# Patient Record
Sex: Female | Born: 1990 | Race: Black or African American | Hispanic: No | Marital: Single | Smoking: Current some day smoker
Health system: Southern US, Community
[De-identification: ages and names within clinical notes are randomized; demographics above are authoritative.]

## PROBLEM LIST (undated history)

## (undated) DIAGNOSIS — K219 Gastro-esophageal reflux disease without esophagitis: Secondary | ICD-10-CM

## (undated) DIAGNOSIS — M419 Scoliosis, unspecified: Secondary | ICD-10-CM

## (undated) HISTORY — PX: BACK SURGERY: SHX140

## (undated) HISTORY — DX: Gastro-esophageal reflux disease without esophagitis: K21.9

---

## 2010-08-15 ENCOUNTER — Ambulatory Visit (INDEPENDENT_AMBULATORY_CARE_PROVIDER_SITE_OTHER): Payer: BC Managed Care – PPO

## 2010-08-15 ENCOUNTER — Inpatient Hospital Stay (INDEPENDENT_AMBULATORY_CARE_PROVIDER_SITE_OTHER)
Admission: RE | Admit: 2010-08-15 | Discharge: 2010-08-15 | Disposition: A | Discharge status: Home / Self Care | Payer: BC Managed Care – PPO | Source: Ambulatory Visit | Attending: Emergency Medicine | Admitting: Emergency Medicine

## 2010-08-15 DIAGNOSIS — S8010XA Contusion of unspecified lower leg, initial encounter: Secondary | ICD-10-CM

## 2010-08-15 DIAGNOSIS — S139XXA Sprain of joints and ligaments of unspecified parts of neck, initial encounter: Secondary | ICD-10-CM

## 2013-03-16 ENCOUNTER — Emergency Department (HOSPITAL_COMMUNITY): Payer: Self-pay

## 2013-03-16 ENCOUNTER — Emergency Department (HOSPITAL_COMMUNITY)
Admission: EM | Admit: 2013-03-16 | Discharge: 2013-03-16 | Disposition: A | Discharge status: Home / Self Care | Payer: Self-pay | Attending: Emergency Medicine | Admitting: Emergency Medicine

## 2013-03-16 ENCOUNTER — Emergency Department (HOSPITAL_COMMUNITY): Payer: BC Managed Care – PPO

## 2013-03-16 ENCOUNTER — Encounter (HOSPITAL_COMMUNITY): Payer: Self-pay | Admitting: Emergency Medicine

## 2013-03-16 DIAGNOSIS — S8990XA Unspecified injury of unspecified lower leg, initial encounter: Secondary | ICD-10-CM | POA: Insufficient documentation

## 2013-03-16 DIAGNOSIS — M25511 Pain in right shoulder: Secondary | ICD-10-CM

## 2013-03-16 DIAGNOSIS — IMO0002 Reserved for concepts with insufficient information to code with codable children: Secondary | ICD-10-CM | POA: Insufficient documentation

## 2013-03-16 DIAGNOSIS — Z8739 Personal history of other diseases of the musculoskeletal system and connective tissue: Secondary | ICD-10-CM | POA: Insufficient documentation

## 2013-03-16 HISTORY — DX: Scoliosis, unspecified: M41.9

## 2013-03-16 MED ORDER — HYDROMORPHONE HCL PF 1 MG/ML IJ SOLN
1.0000 mg | Freq: Once | INTRAMUSCULAR | Status: AC
  Administered 2013-03-16: 1 mg via INTRAMUSCULAR
  Filled 2013-03-16: qty 1

## 2013-03-16 MED ORDER — OXYCODONE-ACETAMINOPHEN 5-325 MG PO TABS: 1.0000 | ORAL_TABLET | ORAL | Status: DC | PRN
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
## 2013-03-16 NOTE — ED Provider Notes (Signed)
Medical screening examination/treatment/procedure(s) were performed by non-physician practitioner and as supervising physician I was immediately available for consultation/collaboration.   Charles B. Sheldon, MD 03/16/13 2011 
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
## 2014-10-27 ENCOUNTER — Encounter (HOSPITAL_COMMUNITY): Payer: Self-pay | Admitting: Emergency Medicine

## 2014-10-27 ENCOUNTER — Emergency Department (HOSPITAL_COMMUNITY)
Admission: EM | Admit: 2014-10-27 | Discharge: 2014-10-27 | Disposition: A | Discharge status: Home / Self Care | Payer: BLUE CROSS/BLUE SHIELD | Attending: Emergency Medicine | Admitting: Emergency Medicine

## 2014-10-27 DIAGNOSIS — Y939 Activity, unspecified: Secondary | ICD-10-CM | POA: Insufficient documentation

## 2014-10-27 DIAGNOSIS — Y929 Unspecified place or not applicable: Secondary | ICD-10-CM | POA: Diagnosis not present

## 2014-10-27 DIAGNOSIS — X58XXXA Exposure to other specified factors, initial encounter: Secondary | ICD-10-CM | POA: Insufficient documentation

## 2014-10-27 DIAGNOSIS — Z8739 Personal history of other diseases of the musculoskeletal system and connective tissue: Secondary | ICD-10-CM | POA: Insufficient documentation

## 2014-10-27 DIAGNOSIS — G43909 Migraine, unspecified, not intractable, without status migrainosus: Secondary | ICD-10-CM | POA: Diagnosis not present

## 2014-10-27 DIAGNOSIS — S0502XA Injury of conjunctiva and corneal abrasion without foreign body, left eye, initial encounter: Secondary | ICD-10-CM | POA: Diagnosis not present

## 2014-10-27 DIAGNOSIS — R0981 Nasal congestion: Secondary | ICD-10-CM | POA: Insufficient documentation

## 2014-10-27 DIAGNOSIS — J0110 Acute frontal sinusitis, unspecified: Secondary | ICD-10-CM | POA: Insufficient documentation

## 2014-10-27 DIAGNOSIS — Y999 Unspecified external cause status: Secondary | ICD-10-CM | POA: Diagnosis not present

## 2014-10-27 DIAGNOSIS — S0592XA Unspecified injury of left eye and orbit, initial encounter: Secondary | ICD-10-CM | POA: Diagnosis present

## 2014-10-27 MED ORDER — FLUORESCEIN SODIUM 1 MG OP STRP
1.0000 | ORAL_STRIP | Freq: Once | OPHTHALMIC | Status: AC
  Administered 2014-10-27: 1 via OPHTHALMIC
  Filled 2014-10-27: qty 1

## 2014-10-27 MED ORDER — SULFACETAMIDE SODIUM 10 % OP SOLN: 1.0000 [drp] | OPHTHALMIC | Status: DC

## 2014-10-27 MED ORDER — PROCHLORPERAZINE MALEATE 10 MG PO TABS
10.0000 mg | ORAL_TABLET | Freq: Once | ORAL | Status: AC
  Administered 2014-10-27: 10 mg via ORAL
  Filled 2014-10-27: qty 1

## 2014-10-27 MED ORDER — DIPHENHYDRAMINE HCL 25 MG PO CAPS
25.0000 mg | ORAL_CAPSULE | Freq: Once | ORAL | Status: AC
  Administered 2014-10-27: 25 mg via ORAL
  Filled 2014-10-27: qty 1

## 2014-10-27 MED ORDER — IBUPROFEN 800 MG PO TABS: 800.0000 mg | ORAL_TABLET | Freq: Three times a day (TID) | ORAL | Status: DC | PRN

## 2014-10-27 MED ORDER — DOXYCYCLINE HYCLATE 100 MG PO CAPS: 100.0000 mg | ORAL_CAPSULE | Freq: Two times a day (BID) | ORAL | Status: DC

## 2014-10-27 MED ORDER — HYDROCODONE-ACETAMINOPHEN 5-325 MG PO TABS: 2.0000 | ORAL_TABLET | ORAL | Status: DC | PRN

## 2014-10-27 MED ORDER — TETRACAINE HCL 0.5 % OP SOLN
2.0000 [drp] | Freq: Once | OPHTHALMIC | Status: AC
  Administered 2014-10-27: 2 [drp] via OPHTHALMIC
  Filled 2014-10-27: qty 2

## 2014-10-27 MED ORDER — KETOROLAC TROMETHAMINE 30 MG/ML IJ SOLN
60.0000 mg | Freq: Once | INTRAMUSCULAR | Status: AC
  Administered 2014-10-27: 60 mg via INTRAMUSCULAR
  Filled 2014-10-27: qty 2

## 2014-10-27 NOTE — ED Notes (Signed)
Pt reports pain around L eye orbit. Pain worse with opening eye. Pt reports increased watery drainage on L eye. Today pt reports she has had decreased vision in that eye. No obvious redness or swelling. No hx of injury to that eye.

## 2014-10-27 NOTE — Discharge Instructions (Signed)
Read the information below.  Use the prescribed medication as directed.  Please discuss all new medications with your pharmacist.  Do not take additional tylenol while taking the prescribed pain medication to avoid overdose.  You may return to the Emergency Department at any time for worsening condition or any new symptoms that concern you.    If there is any possibility that you might be pregnant, please let your health care provider know and discuss this with the pharmacist to ensure medication safety.     If you develop worsening pain in your eye, change in your vision, swelling around your eye, difficulty moving your eye, or fevers greater than 100.4, see your eye doctor or return to the Emergency Department immediately for a recheck.      Corneal Abrasion The cornea is the clear covering at the front and center of the eye. When looking at the colored portion of the eye (iris), you are looking through the cornea. This very thin tissue is made up of many layers. The surface layer is a single layer of cells (corneal epithelium) and is one of the most sensitive tissues in the body. If a scratch or injury causes the corneal epithelium to come off, it is called a corneal abrasion. If the injury extends to the tissues below the epithelium, the condition is called a corneal ulcer. CAUSES   Scratches.  Trauma.  Foreign body in the eye. Some people have recurrences of abrasions in the area of the original injury even after it has healed (recurrent erosion syndrome). Recurrent erosion syndrome generally improves and goes away with time. SYMPTOMS   Eye pain.  Difficulty or inability to keep the injured eye open.  The eye becomes very sensitive to light.  Recurrent erosions tend to happen suddenly, first thing in the morning, usually after waking up and opening the eye. DIAGNOSIS  Your health care provider can diagnose a corneal abrasion during an eye exam. Dye is usually placed in the eye using a  drop or a small paper strip moistened by your tears. When the eye is examined with a special light, the abrasion shows up clearly because of the dye. TREATMENT   Small abrasions may be treated with antibiotic drops or ointment alone.  A pressure patch may be put over the eye. If this is done, follow your doctor's instructions for when to remove the patch. Do not drive or use machines while the eye patch is on. Judging distances is hard to do with a patch on. If the abrasion becomes infected and spreads to the deeper tissues of the cornea, a corneal ulcer can result. This is serious because it can cause corneal scarring. Corneal scars interfere with light passing through the cornea and cause a loss of vision in the involved eye. HOME CARE INSTRUCTIONS  Use medicine or ointment as directed. Only take over-the-counter or prescription medicines for pain, discomfort, or fever as directed by your health care provider.  Do not drive or operate machinery if your eye is patched. Your ability to judge distances is impaired.  If your health care provider has given you a follow-up appointment, it is very important to keep that appointment. Not keeping the appointment could result in a severe eye infection or permanent loss of vision. If there is any problem keeping the appointment, let your health care provider know. SEEK MEDICAL CARE IF:   You have pain, light sensitivity, and a scratchy feeling in one eye or both eyes.  Your pressure  patch keeps loosening up, and you can blink your eye under the patch after treatment.  Any kind of discharge develops from the eye after treatment or if the lids stick together in the morning.  You have the same symptoms in the morning as you did with the original abrasion days, weeks, or months after the abrasion healed. MAKE SURE YOU:   Understand these instructions.  Will watch your condition.  Will get help right away if you are not doing well or get  worse. Document Released: 06/15/2000 Document Revised: 06/23/2013 Document Reviewed: 02/23/2013 [facility] Patient Information 2015 [place], [place]. This information is not intended to replace advice given to you by your health care provider. Make sure you discuss any questions you have with your health care provider.  Sinusitis Sinusitis is redness, soreness, and inflammation of the paranasal sinuses. Paranasal sinuses are air pockets within the bones of your face (beneath the eyes, the middle of the forehead, or above the eyes). In healthy paranasal sinuses, mucus is able to drain out, and air is able to circulate through them by way of your nose. However, when your paranasal sinuses are inflamed, mucus and air can become trapped. This can allow bacteria and other germs to grow and cause infection. Sinusitis can develop quickly and last only a short time (acute) or continue over a long period (chronic). Sinusitis that lasts for more than 12 weeks is considered chronic.  CAUSES  Causes of sinusitis include:  Allergies.  Structural abnormalities, such as displacement of the cartilage that separates your nostrils (deviated septum), which can decrease the air flow through your nose and sinuses and affect sinus drainage.  Functional abnormalities, such as when the small hairs (cilia) that line your sinuses and help remove mucus do not work properly or are not present. SIGNS AND SYMPTOMS  Symptoms of acute and chronic sinusitis are the same. The primary symptoms are pain and pressure around the affected sinuses. Other symptoms include:  Upper toothache.  Earache.  Headache.  Bad breath.  Decreased sense of smell and taste.  A cough, which worsens when you are lying flat.  Fatigue.  Fever.  Thick drainage from your nose, which often is green and may contain pus (purulent).  Swelling and warmth over the affected sinuses. DIAGNOSIS  Your health care provider will perform a physical exam.  During the exam, your health care provider may:  Look in your nose for signs of abnormal growths in your nostrils (nasal polyps).  Tap over the affected sinus to check for signs of infection.  View the inside of your sinuses (endoscopy) using an imaging device that has a light attached (endoscope). If your health care provider suspects that you have chronic sinusitis, one or more of the following tests may be recommended:  Allergy tests.  Nasal culture. A sample of mucus is taken from your nose, sent to a lab, and screened for bacteria.  Nasal cytology. A sample of mucus is taken from your nose and examined by your health care provider to determine if your sinusitis is related to an allergy. TREATMENT  Most cases of acute sinusitis are related to a viral infection and will resolve on their own within 10 days. Sometimes medicines are prescribed to help relieve symptoms (pain medicine, decongestants, nasal steroid sprays, or saline sprays).  However, for sinusitis related to a bacterial infection, your health care provider will prescribe antibiotic medicines. These are medicines that will help kill the bacteria causing the infection.  Rarely, sinusitis is  caused by a fungal infection. In theses cases, your health care provider will prescribe antifungal medicine. For some cases of chronic sinusitis, surgery is needed. Generally, these are cases in which sinusitis recurs more than 3 times per year, despite other treatments. HOME CARE INSTRUCTIONS   Drink plenty of water. Water helps thin the mucus so your sinuses can drain more easily.  Use a humidifier.  Inhale steam 3 to 4 times a day (for example, sit in the bathroom with the shower running).  Apply a warm, moist washcloth to your face 3 to 4 times a day, or as directed by your health care provider.  Use saline nasal sprays to help moisten and clean your sinuses.  Take medicines only as directed by your health care provider.  If you  were prescribed either an antibiotic or antifungal medicine, finish it all even if you start to feel better. SEEK IMMEDIATE MEDICAL CARE IF:  You have increasing pain or severe headaches.  You have nausea, vomiting, or drowsiness.  You have swelling around your face.  You have vision problems.  You have a stiff neck.  You have difficulty breathing. MAKE SURE YOU:   Understand these instructions.  Will watch your condition.  Will get help right away if you are not doing well or get worse. Document Released: 06/18/2005 Document Revised: 11/02/2013 Document Reviewed: 07/03/2011 [facility] Patient Information 2015 [place]. This information is not intended to replace advice given to you by your health care provider. Make sure you discuss any questions you have with your health care provider.

## 2014-10-27 NOTE — ED Provider Notes (Signed)
CSN: [id number]     Arrival date & time 10/27/14  1240 History   First MD Initiated Contact with Patient 10/27/14 1257     Chief Complaint  Patient presents with  . Eye Pain   Patient is a 24 y.o. female presenting with eye pain. The history is provided by the patient. No language interpreter was used.  Eye Pain Associated symptoms include headaches. Pertinent negatives include no chest pain and no shortness of breath.   This chart was scribed for non-physician practitioner Trixie Dredge, PA-C, working with Eber Hong, MD, by Andrew Au, ED Scribe. This patient was seen in room WTR5/WTR5 and the patient's care was started at 1:03 PM.  Stacy Gordon is a 24 y.o. female who presents to the Emergency Department complaining of sudden, constant left eye pain onset 3 days that worsened today. Pt has hx of migraines which she initially believed to be the cause of left eye pain but states she took execdrin without relief to eye pain. Pt rates left eye pain an 8/10 but a 10/10 when touching left eyebrow. Pt reports associated intermittent blurry vision to left eye, eye pain when looking to the left, tenderness around left eye, phonophobia, watery left eye drainage, nasal congestion with green mucous and pressure to left side of face worse when bending forward and nausea with some emesis that began today. Pt denies eye injury/trauma, and contact with chemicals. Pt states she had cold symptoms last week that improved 2 days ago. She denies recent abx use. She denies fever, chills, CP, SOB, cough, photophobia. Pt denies wearing contacts and states she is supposed to wear glass but does not.     Past Medical History  Diagnosis Date  . Scoliosis    Past Surgical History  Procedure Laterality Date  . Back surgery     History reviewed. No pertinent family history. History  Substance Use Topics  . Smoking status: Not on file  . Smokeless tobacco: Not on file  . Alcohol Use: Not on file   OB History     No data available     Review of Systems  Constitutional: Negative for fever and chills.  HENT: Positive for congestion.   Eyes: Positive for pain, discharge and visual disturbance. Negative for photophobia, redness and itching.  Respiratory: Negative for cough and shortness of breath.   Cardiovascular: Negative for chest pain.  Gastrointestinal: Positive for nausea and vomiting.  Musculoskeletal: Negative for back pain, neck pain and neck stiffness.  Skin: Negative for color change and rash.  Allergic/Immunologic: Negative for immunocompromised state.  Neurological: Positive for headaches. Negative for weakness and numbness.  Hematological: Does not bruise/bleed easily.  Psychiatric/Behavioral: Negative for self-injury.   Allergies  Review of patient's allergies indicates no known allergies.  Home Medications   Prior to Admission medications   Medication Sig Start Date End Date Taking? Authorizing Provider  aspirin-acetaminophen-caffeine (EXCEDRIN MIGRAINE) [phone] MG per tablet Take 2 tablets by mouth every 6 (six) hours as needed for pain.    Historical Provider, MD  naproxen sodium (ANAPROX) 220 MG tablet Take 440 mg by mouth as needed (pain).    Historical Provider, MD  oxyCODONE-acetaminophen (PERCOCET) 5-325 MG per tablet Take 1 tablet by mouth every 4 (four) hours as needed for pain. 03/16/13   Jennifer Piepenbrink, PA-C   BP 119/69 mmHg  Pulse 68  Temp(Src) 98 F (36.7 C) (Oral)  Resp 16  SpO2 100% Physical Exam  Constitutional: She appears well-developed and well-nourished. No distress.  HENT:  Head: Normocephalic and atraumatic.  Nose: Left sinus exhibits maxillary sinus tenderness and frontal sinus tenderness.  Eyes: EOM are normal. Lids are everted and swept, no foreign bodies found.  Slit lamp exam:      The left eye shows corneal abrasion.  No left eye lid swelling or erythema. Left eye pressure 17  Neck: Normal range of motion. Neck supple.   Pulmonary/Chest: Effort normal.  Neurological: She is alert.  CN II-XII intact, EOMs intact, no pronator drift, grip strengths equal bilaterally; strength 5/5 in all extremities, sensation intact in all extremities; finger to nose, heel to shin, rapid alternating movements normal; gait is normal.     Skin: She is not diaphoretic.  Nursing note and vitals reviewed.   ED Course  Procedures (including critical care time) DIAGNOSTIC STUDIES: Oxygen Saturation is 100% on RA, normal by my interpretation.    COORDINATION OF CARE: 1:54 PM- Pt advised of plan for treatment and pt agrees.  Labs Review Labs Reviewed - No data to display  Imaging Review No results found.   EKG Interpretation None      MDM   Final diagnoses:  Acute frontal sinusitis, recurrence not specified  Corneal abrasion, left, initial encounter    .Afebrile, nontoxic patient with left facial pain vs headache vs eye pain that began three days ago.    She has pain and tenderness around her eye, which also corresponds to frontal and maxillary sinus on left side.  There is no erythema or edema that would be concerning periorbital or orbital cellulitis.  EOMs are intact.  Conjunctiva is not injected, there are no FB on exam; however, there is a distinct area of abrasion at 4:00 on the left eye.  Her symptoms are worsening with leaning forward and pt has just gotten over a URI, concerning for bacterial sinusitis.  Eye pressure normal. D/C home with pain medication, doxycycline, sulfacetamide eye drops, PCP follow up.  Discussed result, findings, treatment, and follow up  with patient.  Pt given return precautions.  Pt verbalizes understanding and agrees with plan.       I personally performed the services described in this documentation, which was scribed in my presence. The recorded information has been reviewed and is accurate.    Trixie Dredgemily Ewen Varnell, PA-C 10/27/14 1409  Eber HongBrian Miller, MD 10/28/14 [phone]

## 2014-10-30 ENCOUNTER — Encounter (HOSPITAL_COMMUNITY): Payer: Self-pay | Admitting: Emergency Medicine

## 2014-10-30 ENCOUNTER — Emergency Department (HOSPITAL_COMMUNITY)
Admission: EM | Admit: 2014-10-30 | Discharge: 2014-10-30 | Disposition: A | Discharge status: Home / Self Care | Payer: BLUE CROSS/BLUE SHIELD | Attending: Emergency Medicine | Admitting: Emergency Medicine

## 2014-10-30 DIAGNOSIS — Z8739 Personal history of other diseases of the musculoskeletal system and connective tissue: Secondary | ICD-10-CM | POA: Insufficient documentation

## 2014-10-30 DIAGNOSIS — Z792 Long term (current) use of antibiotics: Secondary | ICD-10-CM | POA: Diagnosis not present

## 2014-10-30 DIAGNOSIS — R519 Headache, unspecified: Secondary | ICD-10-CM

## 2014-10-30 DIAGNOSIS — H5712 Ocular pain, left eye: Secondary | ICD-10-CM | POA: Diagnosis not present

## 2014-10-30 DIAGNOSIS — R51 Headache: Secondary | ICD-10-CM | POA: Insufficient documentation

## 2014-10-30 MED ORDER — BUTALBITAL-APAP-CAFFEINE 50-325-40 MG PO TABS
1.0000 | ORAL_TABLET | Freq: Four times a day (QID) | ORAL | Status: DC | PRN
Start: 2014-10-30 — End: 2014-11-05

## 2014-10-30 NOTE — ED Notes (Addendum)
Verbalized understanding discharge instructions. In no acute distress.  Pt given a work note.   During d/c, Pt reported that she is supposed to wear glasses, but has not for a long time.  Pt educated that this could be contributing to her headaches and that she should follow up w/ an eye Dr asap.

## 2014-10-30 NOTE — Discharge Instructions (Signed)

## 2014-10-30 NOTE — ED Provider Notes (Signed)
CSN: [id number]     Arrival date & time 10/30/14  1029 History   First MD Initiated Contact with Patient 10/30/14 1052     Chief Complaint  Patient presents with  . Numbness  . Facial Pain  . Eye Pain     (Consider location/radiation/quality/duration/timing/severity/associated sxs/prior Treatment) HPI  24 year old female presenting with facial pain. Patient described a pressure and throbbing sensation underneath her left eye and around the sinus which has been ongoing for the past week. She described pain as a sharp and pressure sensation primarily on her left side of face with associated blurred vision, skin sensitivity, and teary eye.  Headache is affecting her work. No associated fever, double vision, runny nose and coughing, ear pain, loss of hearing, for facial rash. She was seen in the ED 3 days ago for this and was prescribed doxycycline, hydrocodone, and eyedrops. She does not take hydrocodone during the day while she is at work but states at night the pain medication did help with her headache. However headache persists throughout the day despite taking ibuprofen. She has been taking doxycycline without relief. She does not have any history of migraine and no history of recurrent headache. Denies any medication changes no change in diet. She denies having neck stiffness, numbness or weakness or difficulty thinking. No scintillating scotoma and no history of migraine. She left work today to care for further evaluation. Last treatment was ibuprofen, her pain is currently 8 out of 10.      Past Medical History  Diagnosis Date  . Scoliosis    Past Surgical History  Procedure Laterality Date  . Back surgery     History reviewed. No pertinent family history. History  Substance Use Topics  . Smoking status: Never Smoker   . Smokeless tobacco: Not on file  . Alcohol Use: Not on file   OB History    No data available     Review of Systems  Constitutional: Negative for fever.   HENT: Negative for facial swelling.   Eyes: Positive for pain.  Respiratory: Negative for shortness of breath.   Skin: Negative for rash.  Neurological: Positive for headaches.      Allergies  Review of patient's allergies indicates no known allergies.  Home Medications   Prior to Admission medications   Medication Sig Start Date End Date Taking? Authorizing Provider  aspirin-acetaminophen-caffeine (EXCEDRIN MIGRAINE) 914 [id number] MG per tablet Take 2 tablets by mouth every 6 (six) hours as needed for pain.    Historical Provider, MD  doxycycline (VIBRAMYCIN) 100 MG capsule Take 1 capsule (100 mg total) by mouth 2 (two) times daily. One po bid x 7 days 10/27/14   Trixie DredgeEmily West, PA-C  HYDROcodone-acetaminophen (NORCO/VICODIN) 5-325 MG per tablet Take 2 tablets by mouth every 4 (four) hours as needed for moderate pain or severe pain. 10/27/14   Trixie DredgeEmily West, PA-C  ibuprofen (ADVIL,MOTRIN) 800 MG tablet Take 1 tablet (800 mg total) by mouth every 8 (eight) hours as needed for mild pain or moderate pain. 10/27/14   Trixie DredgeEmily West, PA-C  naproxen sodium (ANAPROX) 220 MG tablet Take 440 mg by mouth as needed (pain).    Historical Provider, MD  oxyCODONE-acetaminophen (PERCOCET) 5-325 MG per tablet Take 1 tablet by mouth every 4 (four) hours as needed for pain. 03/16/13   Jennifer Piepenbrink, PA-C  sulfacetamide (BLEPH-10) 10 % ophthalmic solution Place 1-2 drops into the left eye every 4 (four) hours. 10/27/14   Trixie DredgeEmily West, PA-C   BP 107/59 mmHg  Pulse 74  Temp(Src) 97.6 F (36.4 C) (Oral)  Resp 17  SpO2 100%  LMP 10/30/2014 (Approximate) Physical Exam  Constitutional: She is oriented to person, place, and time. She appears well-developed and well-nourished. No distress.  HENT:  Head: Normocephalic and atraumatic.  Right Ear: External ear normal.  Left Ear: External ear normal.  Nose: Nose normal.  Mouth/Throat: Oropharynx is clear and moist. No oropharyngeal exudate.  Tenderness throughout left  forehead, left periorbital region, and left maxillary region on light palpation without any overlying skin changes, erythema, edema, or crepitus.  Extraocular movement is intact, no evidence of  conjunctivitis and pupils equal round reactive.   No facial asymmetry.   Eyes: Conjunctivae and EOM are normal. Pupils are equal, round, and reactive to light. Right eye exhibits no discharge. Left eye exhibits no discharge. No scleral icterus.  Neck: Normal range of motion. Neck supple. No JVD present. No thyromegaly present.  No nuchal rigidity  Cardiovascular: Normal rate and regular rhythm.   Pulmonary/Chest: Effort normal and breath sounds normal.  Abdominal: Soft. There is no tenderness.  Lymphadenopathy:    She has no cervical adenopathy.  Neurological: She is alert and oriented to person, place, and time. She has normal strength. No cranial nerve deficit or sensory deficit. Coordination normal. GCS eye subscore is 4. GCS verbal subscore is 5. GCS motor subscore is 6.  Skin: No rash noted.  Psychiatric: She has a normal mood and affect.  Nursing note and vitals reviewed.   ED Course  Procedures (including critical care time)  Patient with left-sided facial tenderness felt similar to cluster headache however given the persistent duration it is less likely to be constant. No evidence to suggest facial infection such as orbital cellulitis. Intraocular pressure was checked during last visit and it was normal. Low suspicion for acute angle glaucoma. Patient is nontoxic in appearance, no evidence to suggest meningitis. She has no focal neuro deficit concerning for mass or space occupying lesion. Symptom is not consistence with subarachnoid hemorrhage. Patient will be prescribed Fioricet and give referral to neurology for outpatient follow-up.  Labs Review Labs Reviewed - No data to display  Imaging Review No results found.   EKG Interpretation None      MDM   Final diagnoses:  Bad  headache  Left facial pain    BP 107/59 mmHg  Pulse 74  Temp(Src) 97.6 F (36.4 C) (Oral)  Resp 17  SpO2 100%  LMP 10/30/2014 (Approximate)     Fayrene Helper, PA-C 10/30/14 1121  Eber Hong, MD 10/30/14 1556

## 2014-10-30 NOTE — ED Notes (Signed)
Pt reports left-sided facial pain. Reports "pressure and throbbing" underneath eye and above eye resembling sinus pressure. Was seen here recently and was given Ibuprofen, Hydrocodone and Doxycycline. Says only the Hydrocodone works. Neurologically intact. A&Ox4. Moving all extremities. RR even/unlabored. No facial asymmetry noted. Says, "it hurts to talk and turn my head." Denies symptoms on the right side. No other c/c.

## 2014-11-05 ENCOUNTER — Encounter: Payer: Self-pay | Admitting: Neurology

## 2014-11-05 ENCOUNTER — Ambulatory Visit (INDEPENDENT_AMBULATORY_CARE_PROVIDER_SITE_OTHER): Payer: BLUE CROSS/BLUE SHIELD | Admitting: Neurology

## 2014-11-05 VITALS — BP 108/71 | HR 69 | Resp 18 | Ht 64.0 in | Wt 121.0 lb

## 2014-11-05 DIAGNOSIS — G501 Atypical facial pain: Secondary | ICD-10-CM | POA: Diagnosis not present

## 2014-11-05 DIAGNOSIS — G44009 Cluster headache syndrome, unspecified, not intractable: Secondary | ICD-10-CM

## 2014-11-05 MED ORDER — PREDNISONE 10 MG PO TABS: ORAL_TABLET | ORAL | Status: DC

## 2014-11-05 NOTE — Patient Instructions (Signed)
Cluster Headache Cluster headaches are deeply painful. They normally occur on one side of your head, but they may switch sides. Often, cluster headaches:  Are severe.  Happen often for a few weeks or months and then go away for a while.  Last from 15 minutes to 3 hours.  Happen at the same time each day.  Happen at night.  Happen many times a day. HOME CARE  During times when you have cluster headaches:  Get the same amount of sleep every night, at the same time each night.  Avoid alcohol.  Stop smoking if you smoke. GET HELP IF:  There are changes in how bad or how often your headaches happen.  Your medicines are not helping. GET HELP RIGHT AWAY IF:  You pass out (faint).  You become weak or lose feeling (have numbness) on one side of your body or face.  You see two of everything (double vision).  You feel sick to your stomach (nauseous) or throw up (vomit) and do not stop after several hours.  You are off balance or have trouble talking or walking.  You have neck pain or stiffness.  You have a fever. MAKE SURE YOU:  Understand these instructions.  Will watch your condition.  Will get help right away if you are not doing well or get worse. Document Released: 07/26/2004 Document Revised: 06/23/2013 Document Reviewed: 01/08/2013 Exit[employer] Patient Information 2015 [employer]. This information is not intended to replace advice given to you by your health care provider. Make sure you discuss any questions you have with your health care provider.  

## 2014-11-05 NOTE — Progress Notes (Signed)
Provider:  Melvyn Novas, M D  Referring Provider: No ref. provider found Primary Care Physician:    Chief Complaint  Patient presents with  . Other    facial numbness, headache, rm 11, fiance    HPI:  Stacy Gordon is a 24 y.o. female , seen here as a referral from the ER at [facility],   Stacy Gordon is a 24 year old African-American left-handed female, presenting with atypical facial pain. She had described a pressure and throbbing sensation underneath her left eyebrow and around the sinus region which had been ongoing for the last week of April. The pain that was resulting was also sharp and a pressure sudden cessation it involved primarily the left face and somatic arch. She did have blurred vision, skin sensitivity and tearing. She went to the emergency room twice Dr. Hyacinth Meeker saw her on 10-30-14 after she had already presented on 4-20 7-16 briefly. She flet hot, had no chills and temperature was not measured at home.  Prior to the first presentation she had just gotten over a cold she reports. She took DayQuil and an unbranded nasal spray.  She did not have any prescription medications at the time,  no other medication regimen changes,  and she does deny any kind of trauma or visible infection, no rubor, color or dolor. She reports now that the worst throbbing or sharp sensations are gone but she still has a dull persistent headache that involves the left side of her face, and she has occasional coming and going blurring of the left sided vision. She no longer has the tearing. She has occasionally jaw pain on the left side there is no clicking and no history of TMJ but she does have some dental problems that need to be addressed and she attributed the pain to the dental problem.     Review of Systems: Out of a complete 14 system review, the patient complains of only the following symptoms, and all other reviewed systems are negative. Left facial pain.   History    Social History  . Marital Status: Single    Spouse Name: N/A  . Number of Children: N/A  . Years of Education: N/A   Occupational History  . Not on file.   Social History Main Topics  . Smoking status: Never Smoker   . Smokeless tobacco: Not on file  . Alcohol Use: 0.0 oz/week    0 Standard drinks or equivalent per week     Comment: 1 glass of wine weekly  . Drug Use: No  . Sexual Activity: Not on file   Other Topics Concern  . Not on file   Social History Narrative   Drinks one cup of coffee and one soda.    Family History  Problem Relation Age of Onset  . Migraines Mother   . Hypertension Mother     Past Medical History  Diagnosis Date  . Scoliosis   . GERD (gastroesophageal reflux disease)     Past Surgical History  Procedure Laterality Date  . Back surgery      Current Outpatient Prescriptions  Medication Sig Dispense Refill  . butalbital-acetaminophen-caffeine (FIORICET) 50-325-40 MG per tablet Take 1-2 tablets by mouth every 6 (six) hours as needed for headache. 20 tablet 0  . doxycycline (VIBRAMYCIN) 100 MG capsule Take 1 capsule (100 mg total) by mouth 2 (two) times daily. One po bid x 7 days 14 capsule 0  . HYDROcodone-acetaminophen (NORCO/VICODIN) 5-325 MG per tablet  Take 2 tablets by mouth every 4 (four) hours as needed for moderate pain or severe pain. 10 tablet 0  . ibuprofen (ADVIL,MOTRIN) 800 MG tablet Take 1 tablet (800 mg total) by mouth every 8 (eight) hours as needed for mild pain or moderate pain. 15 tablet 0  . naproxen sodium (ANAPROX) 220 MG tablet Take 440 mg by mouth as needed (pain).    Marland Kitchen. oxyCODONE-acetaminophen (PERCOCET) 5-325 MG per tablet Take 1 tablet by mouth every 4 (four) hours as needed for pain. 20 tablet 0  . sulfacetamide (BLEPH-10) 10 % ophthalmic solution Place 1-2 drops into the left eye every 4 (four) hours. 5 mL 0   No current facility-administered medications for this visit.    Allergies as of 11/05/2014  . (No  Known Allergies)    Vitals: BP 108/71 mmHg  Pulse 69  Resp 18  Ht 5\' 4"  (1.626 m)  Wt 121 lb (54.885 kg)  BMI 20.76 kg/m2  LMP 10/30/2014 (Approximate) Last Weight:  Wt Readings from Last 1 Encounters:  11/05/14 121 lb (54.885 kg)   Last Height:   Ht Readings from Last 1 Encounters:  11/05/14 5\' 4"  (1.626 m)    Physical exam:  General: The patient is awake, alert and appears not in acute distress. The patient is well groomed. Head: Normocephalic, atraumatic. Neck is supple. Mallampati 3 , neck circumference:12 Cardiovascular:  Regular rate and rhythm , without  murmurs or carotid bruit, and without distended neck veins. Respiratory: Lungs are clear to auscultation. Skin:  Without evidence of edema, or rash Trunk:  normal posture.  Neurologic exam : The patient is awake and alert, oriented to place and time.  Memory subjective described as intact.  There is a normal attention span & concentration ability. Speech is fluent without dysarthria, dysphonia or aphasia. Mood and affect are appropriate.  Cranial nerves: Pupils are equal and briskly reactive to light. Funduscopic exam without  evidence of pallor or edema.  Extraocular movements  in vertical and horizontal planes intact and without nystagmus.  Visual fields by finger perimetry are intact. Hearing to finger rub intact.  Facial sensation intact to fine touch. Facial motor strength is symmetric and tongue and uvula move midline.  Tongue protrusion into either cheek is normal. Shoulder shrug is normal.   Motor exam: Normal tone ,muscle bulk and symmetric  strength in all extremities.  Sensory:  Fine touch, pinprick and vibration were tested in all extremities. Proprioception was normal.  Coordination: Rapid alternating movements in the fingers/hands were normal. Finger-to-nose maneuver  normal without evidence of ataxia, dysmetria or tremor.  Gait and station: Patient walks without assistive device and is able  unassisted to climb up to the exam table. Strength within normal limits. Stance is stable and normal.  Tandem gait is unfragmented. Romberg testing is negative .  Deep tendon reflexes: in the  upper and lower extremities are symmetric and intact. Babinski maneuver response is downgoing.   Assessment:  After physical and neurologic examination, review of laboratory studies, imaging, neurophysiology testing and pre-existing records,  45 minutes assessment is that of :   Unilateral eye and face pain rather than a true headache, following a brief possible cold sensation or infection. She has no migrainous features to it no scintillating scotoma no visual aura just some blurring. Bright lights don't bother her. She had tearing in the eye and the feeling of throbbing under the eyebrow ,  completely surrounding the eye. She had pressure retro-orbitally. Once the most severe  episodes subsided she was left with a residual facial pain that is mild more of a numbness and still throbbing. She admitted to drinking alcohol, and we discussed to eliminate that .  Nation's headaches lasted too long to be sunct, but they have a cluster component. He felt that Hydrocort on address this but hydrocortisone is not an optimal choice for the medic for this treatment. We also discussed reduction or cessation of alcohol use the patient is not a smoker we don't need to address nicotine here with her. She should be able to return to work she is able to swallow medications. I will order a CMED CBC and differential and an ANA, I have ordered a prednisone Dosepak. If the prednisone over the next 5 days does not eliminate the facial pain I would order a CT the CT angiogram of the head or MRI of the head with special attention to the orbits.  Plan:  Treatment plan and additional workup :  See above    Melvyn Novasarmen Kristofor Michalowski MD 11/05/2014

## 2014-11-08 ENCOUNTER — Telehealth: Payer: Self-pay

## 2014-11-08 LAB — CBC WITH DIFFERENTIAL/PLATELET
BASOS ABS: 0 10*3/uL (ref 0.0–0.2)
BASOS: 0 %
EOS (ABSOLUTE): 0.2 10*3/uL (ref 0.0–0.4)
Eos: 3 %
HEMOGLOBIN: 12.3 g/dL (ref 11.1–15.9)
Hematocrit: 36.6 % (ref 34.0–46.6)
Immature Grans (Abs): 0 10*3/uL (ref 0.0–0.1)
Immature Granulocytes: 0 %
LYMPHS ABS: 1.7 10*3/uL (ref 0.7–3.1)
Lymphs: 29 %
MCH: 28.9 pg (ref 26.6–33.0)
MCHC: 33.6 g/dL (ref 31.5–35.7)
MCV: 86 fL (ref 79–97)
Monocytes Absolute: 0.6 10*3/uL (ref 0.1–0.9)
Monocytes: 11 %
NEUTROS ABS: 3.3 10*3/uL (ref 1.4–7.0)
NEUTROS PCT: 57 %
PLATELETS: 413 10*3/uL — AB (ref 150–379)
RBC: 4.25 x10E6/uL (ref 3.77–5.28)
RDW: 13.8 % (ref 12.3–15.4)
WBC: 5.8 10*3/uL (ref 3.4–10.8)

## 2014-11-08 LAB — COMPREHENSIVE METABOLIC PANEL
A/G RATIO: 1.4 (ref 1.1–2.5)
ALBUMIN: 4.4 g/dL (ref 3.5–5.5)
ALT: 9 IU/L (ref 0–32)
AST: 15 IU/L (ref 0–40)
Alkaline Phosphatase: 49 IU/L (ref 39–117)
BILIRUBIN TOTAL: 0.3 mg/dL (ref 0.0–1.2)
BUN/Creatinine Ratio: 16 (ref 8–20)
BUN: 12 mg/dL (ref 6–20)
CALCIUM: 9.8 mg/dL (ref 8.7–10.2)
CO2: 28 mmol/L (ref 18–29)
CREATININE: 0.75 mg/dL (ref 0.57–1.00)
Chloride: 100 mmol/L (ref 97–108)
GFR, EST AFRICAN AMERICAN: 128 mL/min/{1.73_m2} (ref >59)
GFR, EST NON AFRICAN AMERICAN: 111 mL/min/{1.73_m2} (ref >59)
GLOBULIN, TOTAL: 3.2 g/dL (ref 1.5–4.5)
GLUCOSE: 80 mg/dL (ref 65–99)
POTASSIUM: 5.1 mmol/L (ref 3.5–5.2)
Sodium: 141 mmol/L (ref 134–144)
TOTAL PROTEIN: 7.6 g/dL (ref 6.0–8.5)

## 2014-11-08 LAB — ANA W/REFLEX IF POSITIVE: ANA: NEGATIVE

## 2014-11-08 NOTE — Telephone Encounter (Signed)
Called pt give lab results. No answer, left message on cell to please call me back.

## 2014-11-09 NOTE — Progress Notes (Signed)
Quick Note:  I called pt and relayed the lab results to her. Normal and elevated platelets can be seen in inflammation. Results released, pt will check mychart, if not received will come by for paper copy. ______

## 2014-11-19 ENCOUNTER — Encounter: Payer: Self-pay | Admitting: Nurse Practitioner

## 2014-11-19 ENCOUNTER — Ambulatory Visit (INDEPENDENT_AMBULATORY_CARE_PROVIDER_SITE_OTHER): Payer: BLUE CROSS/BLUE SHIELD | Admitting: Nurse Practitioner

## 2014-11-19 VITALS — BP 123/78 | HR 72 | Ht 64.0 in | Wt 122.2 lb

## 2014-11-19 DIAGNOSIS — G44009 Cluster headache syndrome, unspecified, not intractable: Secondary | ICD-10-CM | POA: Diagnosis not present

## 2014-11-19 DIAGNOSIS — G501 Atypical facial pain: Secondary | ICD-10-CM | POA: Diagnosis not present

## 2014-11-19 NOTE — Patient Instructions (Signed)
No further headaches Given list of foods that can cause migraines Follow-up when necessary

## 2014-11-19 NOTE — Progress Notes (Signed)
[facility]  PATIENT: Stacy Gordon DOB: [birth year]   REASON FOR VISIT: Follow-up for headaches  HISTORY FROM: Patient    HISTORY OF PRESENT ILLNESS: Stacy Gordon, 24 year old female returns for follow-up. She was just evaluated by Dr. Vickey Huger on 5/6/ 2016 for facial pain and headache. She took a prednisone Dosepak however patient states today that since she has been smoking marijuana she has not had further headaches or facial pain. She is only on ibuprofen. She returns for reevaluation.  HISTORY CD 5/6/16[name] is a 24 year old African-American left-handed female, presenting with atypical facial pain. She had described a pressure and throbbing sensation underneath her left eyebrow and around the sinus region which had been ongoing for the last week of April. The pain that was resulting was also sharp and a pressure sudden cessation it involved primarily the left face and somatic arch. She did have blurred vision, skin sensitivity and tearing. She went to the emergency room twice Dr. Hyacinth Meeker saw her on 10-30-14 after she had already presented on 4-20 7-16 briefly. She flet hot, had no chills and temperature was not measured at home.  Prior to the first presentation she had just gotten over a cold she reports. She took DayQuil and an unbranded nasal spray. She did not have any prescription medications at the time, no other medication regimen changes, and she does deny any kind of trauma or visible infection, no rubor, color or dolor. She reports now that the worst throbbing or sharp sensations are gone but she still has a dull persistent headache that involves the left side of her face, and she has occasional coming and going blurring of the left sided vision. She no longer has the tearing. She has occasionally jaw pain on the left side there is no clicking and no history of TMJ but she does have some dental problems that need to be addressed and she attributed the pain to  the dental problem   REVIEW OF SYSTEMS: Full 14 system review of systems performed and notable only for those listed, all others are neg:  Constitutional: neg  Cardiovascular: neg Ear/Nose/Throat: neg  Skin: neg Eyes: neg Respiratory: neg Gastroitestinal: neg  Hematology/Lymphatic: neg  Endocrine: neg Musculoskeletal:neg Allergy/Immunology: neg Neurological: neg Psychiatric: neg Sleep : neg   ALLERGIES: No Known Allergies  HOME MEDICATIONS: Outpatient Prescriptions Prior to Visit  Medication Sig Dispense Refill  . ibuprofen (ADVIL,MOTRIN) 800 MG tablet Take 1 tablet (800 mg total) by mouth every 8 (eight) hours as needed for mild pain or moderate pain. 15 tablet 0  . predniSONE (DELTASONE) 10 MG tablet Take 3 in AM Saturday and Sunday, than 2 in AM mo and Tuesday, than one in AM until end of pack. 15 tablet 0  . HYDROcodone-acetaminophen (NORCO/VICODIN) 5-325 MG per tablet Take 2 tablets by mouth every 4 (four) hours as needed for moderate pain or severe pain. 10 tablet 0  . naproxen sodium (ANAPROX) 220 MG tablet Take 440 mg by mouth as needed (pain).     No facility-administered medications prior to visit.    PAST MEDICAL HISTORY: Past Medical History  Diagnosis Date  . Scoliosis   . GERD (gastroesophageal reflux disease)     PAST SURGICAL HISTORY: Past Surgical History  Procedure Laterality Date  . Back surgery      FAMILY HISTORY: Family History  Problem Relation Age of Onset  . Migraines Mother   . Hypertension Mother     SOCIAL HISTORY: History   Social History  .  Marital Status: Single    Spouse Name: N/A  . Number of Children: N/A  . Years of Education: N/A   Occupational History  . Not on file.   Social History Main Topics  . Smoking status: Never Smoker   . Smokeless tobacco: Not on file  . Alcohol Use: 0.0 oz/week    0 Standard drinks or equivalent per week     Comment: 1 glass of wine weekly  . Drug Use: No  . Sexual Activity: Not  on file   Other Topics Concern  . Not on file   Social History Narrative   Drinks one cup of coffee and one soda.     PHYSICAL EXAM  Filed Vitals:   11/19/14 0759  BP: 123/78  Pulse: 72  Height: 5\' 4"  (1.626 m)  Weight: 122 lb 3.2 oz (55.43 kg)   Body mass index is 20.97 kg/(m^2).  Generalized: Well developed, in no acute distress  Head: normocephalic and atraumatic,. Oropharynx benign  Neck: Supple, no carotid bruits  Cardiac: Regular rate rhythm, no murmur  Musculoskeletal: No deformity   Neurological examination   Mentation: Alert oriented to time, place, history taking. Attention span and concentration appropriate. Recent and remote memory intact.  Follows all commands speech and language fluent.   Cranial nerve II-XII: Pupils were equal round reactive to light extraocular movements were full, visual field were full on confrontational test. Facial sensation and strength were normal. hearing was intact to finger rubbing bilaterally. Uvula tongue midline. head turning and shoulder shrug were normal and symmetric.Tongue protrusion into cheek strength was normal. Motor: normal bulk and tone, full strength in the BUE, BLE, fine finger movements normal, no pronator drift. No focal weakness Sensory: normal and symmetric to light touch, pinprick, and  Vibration, proprioception  Coordination: finger-nose-finger, heel-to-shin bilaterally, no dysmetria Reflexes: Brachioradialis 2/2, biceps 2/2, triceps 2/2, patellar 2/2, Achilles 2/2, plantar responses were flexor bilaterally. Gait and Station: Rising up from seated position without assistance, normal stance,  moderate stride, good arm swing, smooth turning, able to perform tiptoe, and heel walking without difficulty. Tandem gait is steady  DIAGNOSTIC DATA (LABS, IMAGING, TESTING) - I reviewed patient records, labs, notes, testing and imaging myself where available.  Lab Results  Component Value Date   WBC 5.8 11/05/2014   HCT  36.6 11/05/2014      Component Value Date/Time   NA 141 11/05/2014 1044   K 5.1 11/05/2014 1044   CL 100 11/05/2014 1044   CO2 28 11/05/2014 1044   GLUCOSE 80 11/05/2014 1044   BUN 12 11/05/2014 1044   CREATININE 0.75 11/05/2014 1044   CALCIUM 9.8 11/05/2014 1044   PROT 7.6 11/05/2014 1044   AST 15 11/05/2014 1044   ALT 9 11/05/2014 1044   ALKPHOS 49 11/05/2014 1044   BILITOT 0.3 11/05/2014 1044   GFRNONAA 111 11/05/2014 1044   GFRAA 128 11/05/2014 1044    ASSESSMENT AND PLAN  24 y.o. year old female  has a past medical history of Scoliosis and GERD (gastroesophageal reflux disease). headaches and facial pain here to follow-up. Patient claims she has not had further headaches or facial pain since she has been smoking  marijuana   Given list of foods that can cause migraines She is not interested in any further testing Follow-up when necessary, no follow-up planned Nilda Riggs, [facility], [facility], APRN  [facility] [address], [address] [place], [place] [address] [phone]

## 2014-11-19 NOTE — Progress Notes (Signed)
I agree with the assessment and plan as directed by NP .The patient is known to me .   Arianne Klinge, MD  

## 2014-12-28 ENCOUNTER — Emergency Department (HOSPITAL_COMMUNITY)
Admission: EM | Admit: 2014-12-28 | Discharge: 2014-12-28 | Disposition: A | Discharge status: Home / Self Care | Payer: BLUE CROSS/BLUE SHIELD | Attending: Emergency Medicine | Admitting: Emergency Medicine

## 2014-12-28 ENCOUNTER — Encounter (HOSPITAL_COMMUNITY): Payer: Self-pay

## 2014-12-28 DIAGNOSIS — Z8719 Personal history of other diseases of the digestive system: Secondary | ICD-10-CM | POA: Diagnosis not present

## 2014-12-28 DIAGNOSIS — R21 Rash and other nonspecific skin eruption: Secondary | ICD-10-CM | POA: Diagnosis not present

## 2014-12-28 DIAGNOSIS — Z8739 Personal history of other diseases of the musculoskeletal system and connective tissue: Secondary | ICD-10-CM | POA: Insufficient documentation

## 2014-12-28 DIAGNOSIS — L731 Pseudofolliculitis barbae: Secondary | ICD-10-CM | POA: Diagnosis not present

## 2014-12-28 MED ORDER — SULFAMETHOXAZOLE-TRIMETHOPRIM 800-160 MG PO TABS: 1.0000 | ORAL_TABLET | Freq: Two times a day (BID) | ORAL | Status: DC

## 2014-12-28 NOTE — ED Notes (Signed)
Pt c/o abscess in L perineal area x 1 day.  Pain score 5/10.  Denies discharge.

## 2014-12-28 NOTE — ED Provider Notes (Signed)
CSN: [id number]     Arrival date & time 12/28/14  1516 History   First MD Initiated Contact with Patient 12/28/14 1659     Chief Complaint  Patient presents with  . Abscess     (Consider location/radiation/quality/duration/timing/severity/associated sxs/prior Treatment) HPI   Blood pressure 122/67, pulse 72, temperature 98.5 F (36.9 C), temperature source Oral, resp. rate 18, weight 120 lb (54.432 kg), last menstrual period 11/29/2014, SpO2 100 %.  Stacy Gordon is a 24 y.o. female complaining of painful swelling to left little be noticed one day ago. She rates her pain at 5 out of 10, she doesn't have a history of frequent abscesses, diabetes. She shaves the area. No fever, chills, nausea, vomiting.   Past Medical History  Diagnosis Date  . Scoliosis   . GERD (gastroesophageal reflux disease)    Past Surgical History  Procedure Laterality Date  . Back surgery     Family History  Problem Relation Age of Onset  . Migraines Mother   . Hypertension Mother    History  Substance Use Topics  . Smoking status: Never Smoker   . Smokeless tobacco: Not on file  . Alcohol Use: 0.0 oz/week    0 Standard drinks or equivalent per week     Comment: 1 glass of wine weekly   OB History    No data available     Review of Systems  10 systems reviewed and found to be negative, except as noted in the HPI.   Allergies  Review of patient's allergies indicates no known allergies.  Home Medications   Prior to Admission medications   Medication Sig Start Date End Date Taking? Authorizing Provider  ibuprofen (ADVIL,MOTRIN) 800 MG tablet Take 1 tablet (800 mg total) by mouth every 8 (eight) hours as needed for mild pain or moderate pain. 10/27/14   Trixie Dredge, PA-C   BP 122/67 mmHg  Pulse 72  Temp(Src) 98.5 F (36.9 C) (Oral)  Resp 18  Wt 120 lb (54.432 kg)  SpO2 100%  LMP 11/29/2014 Physical Exam  Constitutional: She is oriented to person, place, and time. She appears  well-developed and well-nourished. No distress.  HENT:  Head: Normocephalic.  Eyes: Conjunctivae and EOM are normal.  Cardiovascular: Normal rate.   Pulmonary/Chest: Effort normal. No stridor.  Musculoskeletal: Normal range of motion.  Neurological: She is alert and oriented to person, place, and time.  Skin: Rash noted.  Patient has ingrown hair with  no overlying cellulitis or significant tenderness to palpation.  Psychiatric: She has a normal mood and affect.  Nursing note and vitals reviewed.   ED Course  Procedures (including critical care time) Labs Review Labs Reviewed - No data to display  Imaging Review No results found.   EKG Interpretation None      MDM   Final diagnoses:  Rash and nonspecific skin eruption    Filed Vitals:   12/28/14 1521  BP: 122/67  Pulse: 72  Temp: 98.5 F (36.9 C)  TempSrc: Oral  Resp: 18  Weight: 120 lb (54.432 kg)  SpO2: 100%    Jene Huq is a pleasant 24 y.o. female presenting with ingrown hair to left groin area, I don't think an IND would be very helpful for this patient however I have offered it in shared decision-making she has declined. Will start her on Bactrim, have recommended warm compresses. We've had an extensive discussion of return precautions patient verbalizes her understanding.  Evaluation does not show pathology that would require ongoing  emergent intervention or inpatient treatment. Pt is hemodynamically stable and mentating appropriately. Discussed findings and plan with patient/guardian, who agrees with care plan. All questions answered. Return precautions discussed and outpatient follow up given.   New Prescriptions   SULFAMETHOXAZOLE-TRIMETHOPRIM (BACTRIM DS) 800-160 MG PER TABLET    Take 1 tablet by mouth 2 (two) times daily.         Stacy Emeryicole Chimamanda Siegfried, PA-C 12/28/14 1730  Arby BarretteMarcy Pfeiffer, MD 12/28/14 [phone]

## 2014-12-28 NOTE — Discharge Instructions (Signed)
°  For pain control please take ibuprofen (also known as Motrin or Advil)  (this is normally 4 over the counter pills) 3 times a day  for 5 days. Take with food to minimize stomach irritation.  Take your antibiotics as directed and to completion. You should never have any leftover antibiotics! Push fluids and stay well hydrated.   Any antibiotic use can reduce the efficacy of hormonal birth control. Please use back up method of contraception.   Apply warm compresses to the area minimum of 4 times per day. If the pain increases sharply, if you develop fever, nausea or vomiting do not hesitate to return to the emergency room. If the area softens and feels like there is fluid underneath the surface of the skin return to the ED so we can lance the boil.

## 2015-02-15 ENCOUNTER — Encounter (HOSPITAL_COMMUNITY): Payer: Self-pay | Admitting: *Deleted

## 2015-02-15 ENCOUNTER — Emergency Department (HOSPITAL_COMMUNITY)
Admission: EM | Admit: 2015-02-15 | Discharge: 2015-02-15 | Disposition: A | Discharge status: Home / Self Care | Payer: BLUE CROSS/BLUE SHIELD | Attending: Emergency Medicine | Admitting: Emergency Medicine

## 2015-02-15 DIAGNOSIS — Z792 Long term (current) use of antibiotics: Secondary | ICD-10-CM | POA: Diagnosis not present

## 2015-02-15 DIAGNOSIS — Z8719 Personal history of other diseases of the digestive system: Secondary | ICD-10-CM | POA: Diagnosis not present

## 2015-02-15 DIAGNOSIS — R21 Rash and other nonspecific skin eruption: Secondary | ICD-10-CM | POA: Diagnosis present

## 2015-02-15 DIAGNOSIS — Z8739 Personal history of other diseases of the musculoskeletal system and connective tissue: Secondary | ICD-10-CM | POA: Diagnosis not present

## 2015-02-15 MED ORDER — PREDNISONE 20 MG PO TABS
60.0000 mg | ORAL_TABLET | Freq: Once | ORAL | Status: AC
  Administered 2015-02-15: 60 mg via ORAL
  Filled 2015-02-15: qty 3

## 2015-02-15 MED ORDER — PREDNISONE 10 MG PO TABS: ORAL_TABLET | ORAL | Status: DC

## 2015-02-15 MED ORDER — DIPHENHYDRAMINE HCL 25 MG PO TABS: 25.0000 mg | ORAL_TABLET | Freq: Four times a day (QID) | ORAL | Status: DC

## 2015-02-15 NOTE — ED Provider Notes (Signed)
CSN: [id number]     Arrival date & time 02/15/15  1303 History  This chart was scribed for non-physician practitioner, Jaynie Crumble, PA-C, working with Raeford Razor, MD by Marica Otter, ED Scribe. This patient was seen in room WTR9/WTR9 and the patient's care was started at 2:07 PM.   Chief Complaint  Patient presents with  . Rash   The history is provided by the patient. No language interpreter was used.   PCP: No PCP Per Patient HPI Comments: Stacy Gordon is a 24 y.o. female, with PMHx noted below, who presents to the Emergency Department complaining of acute, sudden onset, itching, burning rash to the neck onset this morning. Pt reports applying Gold Bond without relief. Pt denies any new exposures, including any new hygiene products, clothing, lotions, detergent, or jewelry. Pt denies SOB, fever, sore throat, swelling of tongue, trouble swallowing or any other Sx at this time.   Past Medical History  Diagnosis Date  . Scoliosis   . GERD (gastroesophageal reflux disease)    Past Surgical History  Procedure Laterality Date  . Back surgery     Family History  Problem Relation Age of Onset  . Migraines Mother   . Hypertension Mother    Social History  Substance Use Topics  . Smoking status: Never Smoker   . Smokeless tobacco: None  . Alcohol Use: 0.0 oz/week    0 Standard drinks or equivalent per week     Comment: 1 glass of wine weekly   OB History    No data available     Review of Systems  Constitutional: Negative for fever and chills.  HENT: Negative for sore throat and trouble swallowing.   Respiratory: Negative for shortness of breath.   Skin: Positive for rash.   Allergies  Review of patient's allergies indicates no known allergies.  Home Medications   Prior to Admission medications   Medication Sig Start Date End Date Taking? Authorizing Provider  sulfamethoxazole-trimethoprim (BACTRIM DS) 800-160 MG per tablet Take 1 tablet by mouth 2 (two) times  daily. 12/28/14   Nicole Pisciotta, PA-C   Triage Vitals: BP 120/74 mmHg  Pulse 73  Temp(Src) 97.9 F (36.6 C) (Oral)  Resp 18  SpO2 98%  LMP 02/13/2015 Physical Exam  Constitutional: She is oriented to person, place, and time. She appears well-developed and well-nourished.  HENT:  Head: Normocephalic.  Eyes: EOM are normal.  Neck: Normal range of motion.  Pulmonary/Chest: Effort normal.  Abdominal: She exhibits no distension.  Musculoskeletal: Normal range of motion.  Neurological: She is alert and oriented to person, place, and time.  Skin:  Erythematous to slightly hyperpigmented rash to the anterior neck. Itchy. No pustules, papules, ulcerations, vesicles.  Psychiatric: She has a normal mood and affect.  Nursing note and vitals reviewed.   ED Course  Procedures (including critical care time) DIAGNOSTIC STUDIES: Oxygen Saturation is 98% on RA, nl by my interpretation.    COORDINATION OF CARE: 2:10 PM: Discussed treatment plan which includes prednisone with pt at bedside; patient verbalizes understanding and agrees with treatment plan. Pt advised to return to ED immediately if Sx worsen.   MDM   Final diagnoses:  Rash    Patient with itchy rash to the neck. She does not recall any new products, no new necklaces on jewelry, no new detergent. She woke up this morning with this rash. Rash is very itchy.No evidence of infection or cellulitis.  Filed Vitals:   02/15/15 1315 02/15/15 1434  BP: 120/74  Pulse: 73 79  Temp: 97.9 F (36.6 C)   TempSrc: Oral   Resp: 18   SpO2: 98% 100%    I personally performed the services described in this documentation, which was scribed in my presence. The recorded information has been reviewed and is accurate.    Jaynie Crumble, PA-C 02/15/15 1944  Raeford Razor, MD 02/17/15 [phone]

## 2015-02-15 NOTE — Discharge Instructions (Signed)
Take Benadryl as prescribed for itching. Take prednisone as prescribed until all gone starting tomorrow. Use hypoallergenic products. If rash does not improve or is worsening please follow-up with primary care doctor or dermatologist.   Contact Dermatitis Contact dermatitis is a reaction to certain substances that touch the skin. Contact dermatitis can be either irritant contact dermatitis or allergic contact dermatitis. Irritant contact dermatitis does not require previous exposure to the substance for a reaction to occur.Allergic contact dermatitis only occurs if you have been exposed to the substance before. Upon a repeat exposure, your body reacts to the substance.  CAUSES  Many substances can cause contact dermatitis. Irritant dermatitis is most commonly caused by repeated exposure to mildly irritating substances, such as:  Makeup.  Soaps.  Detergents.  Bleaches.  Acids.  Metal salts, such as nickel. Allergic contact dermatitis is most commonly caused by exposure to:  Poisonous plants.  Chemicals (deodorants, shampoos).  Jewelry.  Latex.  Neomycin in triple antibiotic cream.  Preservatives in products, including clothing. SYMPTOMS  The area of skin that is exposed may develop:  Dryness or flaking.  Redness.  Cracks.  Itching.  Pain or a burning sensation.  Blisters. With allergic contact dermatitis, there may also be swelling in areas such as the eyelids, mouth, or genitals.  DIAGNOSIS  Your caregiver can usually tell what the problem is by doing a physical exam. In cases where the cause is uncertain and an allergic contact dermatitis is suspected, a patch skin test may be performed to help determine the cause of your dermatitis. TREATMENT Treatment includes protecting the skin from further contact with the irritating substance by avoiding that substance if possible. Barrier creams, powders, and gloves may be helpful. Your caregiver may also  recommend:  Steroid creams or ointments applied 2 times daily. For best results, soak the rash area in cool water for 20 minutes. Then apply the medicine. Cover the area with a plastic wrap. You can store the steroid cream in the refrigerator for a "chilly" effect on your rash. That may decrease itching. Oral steroid medicines may be needed in more severe cases.  Antibiotics or antibacterial ointments if a skin infection is present.  Antihistamine lotion or an antihistamine taken by mouth to ease itching.  Lubricants to keep moisture in your skin.  Burow's solution to reduce redness and soreness or to dry a weeping rash. Mix one packet or tablet of solution in 2 cups cool water. Dip a clean washcloth in the mixture, wring it out a bit, and put it on the affected area. Leave the cloth in place for 30 minutes. Do this as often as possible throughout the day.  Taking several cornstarch or baking soda baths daily if the area is too large to cover with a washcloth. Harsh chemicals, such as alkalis or acids, can cause skin damage that is like a burn. You should flush your skin for 15 to 20 minutes with cold water after such an exposure. You should also seek immediate medical care after exposure. Bandages (dressings), antibiotics, and pain medicine may be needed for severely irritated skin.  HOME CARE INSTRUCTIONS  Avoid the substance that caused your reaction.  Keep the area of skin that is affected away from hot water, soap, sunlight, chemicals, acidic substances, or anything else that would irritate your skin.  Do not scratch the rash. Scratching may cause the rash to become infected.  You may take cool baths to help stop the itching.  Only take over-the-counter or prescription  medicines as directed by your caregiver.  See your caregiver for follow-up care as directed to make sure your skin is healing properly. SEEK MEDICAL CARE IF:   Your condition is not better after 3 days of  treatment.  You seem to be getting worse.  You see signs of infection such as swelling, tenderness, redness, soreness, or warmth in the affected area.  You have any problems related to your medicines. Document Released: 06/15/2000 Document Revised: 09/10/2011 Document Reviewed: 11/21/2010 [facility] Patient Information 2015 [place], [place]. This information is not intended to replace advice given to you by your health care provider. Make sure you discuss any questions you have with your health care provider.

## 2015-02-15 NOTE — ED Notes (Addendum)
Pt complains of an itching, burning rash around her neck since this morning. Pt denies difficulty breathing. Pt denies recent different soap/lotion/jewelry use. Pt states she used Gold Bond with some relief.

## 2015-03-28 ENCOUNTER — Emergency Department (HOSPITAL_COMMUNITY)
Admission: EM | Admit: 2015-03-28 | Discharge: 2015-03-28 | Disposition: A | Discharge status: Home / Self Care | Payer: BLUE CROSS/BLUE SHIELD | Attending: Emergency Medicine | Admitting: Emergency Medicine

## 2015-03-28 ENCOUNTER — Encounter (HOSPITAL_COMMUNITY): Payer: Self-pay | Admitting: Emergency Medicine

## 2015-03-28 ENCOUNTER — Emergency Department (HOSPITAL_COMMUNITY): Payer: BLUE CROSS/BLUE SHIELD

## 2015-03-28 DIAGNOSIS — Y9289 Other specified places as the place of occurrence of the external cause: Secondary | ICD-10-CM | POA: Diagnosis not present

## 2015-03-28 DIAGNOSIS — S0990XA Unspecified injury of head, initial encounter: Secondary | ICD-10-CM | POA: Diagnosis present

## 2015-03-28 DIAGNOSIS — Y998 Other external cause status: Secondary | ICD-10-CM | POA: Insufficient documentation

## 2015-03-28 DIAGNOSIS — R251 Tremor, unspecified: Secondary | ICD-10-CM | POA: Diagnosis not present

## 2015-03-28 DIAGNOSIS — Z8719 Personal history of other diseases of the digestive system: Secondary | ICD-10-CM | POA: Diagnosis not present

## 2015-03-28 DIAGNOSIS — Y9389 Activity, other specified: Secondary | ICD-10-CM | POA: Diagnosis not present

## 2015-03-28 DIAGNOSIS — Z8739 Personal history of other diseases of the musculoskeletal system and connective tissue: Secondary | ICD-10-CM | POA: Diagnosis not present

## 2015-03-28 MED ORDER — KETOROLAC TROMETHAMINE 30 MG/ML IJ SOLN
30.0000 mg | Freq: Once | INTRAMUSCULAR | Status: AC
  Administered 2015-03-28: 30 mg via INTRAVENOUS
  Filled 2015-03-28: qty 1

## 2015-03-28 MED ORDER — SODIUM CHLORIDE 0.9 % IV BOLUS (SEPSIS)
1000.0000 mL | Freq: Once | INTRAVENOUS | Status: AC
  Administered 2015-03-28: 1000 mL via INTRAVENOUS

## 2015-03-28 NOTE — Discharge Instructions (Signed)
Return here as needed.  Follow-up with the neurologist provided.  Increase your fluid intake °

## 2015-03-28 NOTE — ED Notes (Signed)
MD notified of BP, 93/44.  MD ordered fluid bolus.

## 2015-03-28 NOTE — ED Notes (Signed)
Reviewed d/c instructions and follow-up care with pt. Pt verbalized understanding 

## 2015-03-28 NOTE — ED Notes (Signed)
Family at bedside. 
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
## 2015-03-28 NOTE — ED Notes (Signed)
Taken to CT at this time. 
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
## 2015-04-27 ENCOUNTER — Emergency Department (HOSPITAL_COMMUNITY): Payer: BLUE CROSS/BLUE SHIELD

## 2015-04-27 ENCOUNTER — Emergency Department (HOSPITAL_COMMUNITY)
Admission: EM | Admit: 2015-04-27 | Discharge: 2015-04-27 | Disposition: A | Discharge status: Home / Self Care | Payer: BLUE CROSS/BLUE SHIELD | Attending: Physician Assistant | Admitting: Physician Assistant

## 2015-04-27 ENCOUNTER — Encounter (HOSPITAL_COMMUNITY): Payer: Self-pay | Admitting: Emergency Medicine

## 2015-04-27 DIAGNOSIS — Y9289 Other specified places as the place of occurrence of the external cause: Secondary | ICD-10-CM | POA: Insufficient documentation

## 2015-04-27 DIAGNOSIS — Y9389 Activity, other specified: Secondary | ICD-10-CM | POA: Insufficient documentation

## 2015-04-27 DIAGNOSIS — Z8669 Personal history of other diseases of the nervous system and sense organs: Secondary | ICD-10-CM | POA: Diagnosis not present

## 2015-04-27 DIAGNOSIS — W228XXA Striking against or struck by other objects, initial encounter: Secondary | ICD-10-CM | POA: Insufficient documentation

## 2015-04-27 DIAGNOSIS — S60221A Contusion of right hand, initial encounter: Secondary | ICD-10-CM

## 2015-04-27 DIAGNOSIS — Z720 Tobacco use: Secondary | ICD-10-CM | POA: Diagnosis not present

## 2015-04-27 DIAGNOSIS — Y998 Other external cause status: Secondary | ICD-10-CM | POA: Diagnosis not present

## 2015-04-27 DIAGNOSIS — S6992XA Unspecified injury of left wrist, hand and finger(s), initial encounter: Secondary | ICD-10-CM | POA: Diagnosis present

## 2015-04-27 DIAGNOSIS — Z8719 Personal history of other diseases of the digestive system: Secondary | ICD-10-CM | POA: Diagnosis not present

## 2015-04-27 DIAGNOSIS — S60222A Contusion of left hand, initial encounter: Secondary | ICD-10-CM | POA: Diagnosis not present

## 2015-04-27 MED ORDER — NAPROXEN 500 MG PO TABS: 500.0000 mg | ORAL_TABLET | Freq: Two times a day (BID) | ORAL | Status: AC
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
## 2015-04-27 NOTE — Discharge Instructions (Signed)
Hand Contusion  A hand contusion is a deep bruise on your hand area. Contusions are the result of an injury that caused bleeding under the skin. The contusion may turn blue, purple, or yellow. Minor injuries will give you a painless contusion, but more severe contusions may stay painful and swollen for a few weeks.  CAUSES   A contusion is usually caused by a blow, trauma, or direct force to an area of the body.  SYMPTOMS    Swelling and redness of the injured area.   Discoloration of the injured area.   Tenderness and soreness of the injured area.   Pain.  DIAGNOSIS   The diagnosis can be made by taking a history and performing a physical exam. An X-ray, CT scan, or MRI may be needed to determine if there were any associated injuries, such as broken bones (fractures).  TREATMENT   Often, the best treatment for a hand contusion is resting, elevating, icing, and applying cold compresses to the injured area. Over-the-counter medicines may also be recommended for pain control.  HOME CARE INSTRUCTIONS    Put ice on the injured area.    Put ice in a plastic bag.    Place a towel between your skin and the bag.    Leave the ice on for 15-20 minutes, 03-04 times a day.   Only take over-the-counter or prescription medicines as directed by your caregiver. Your caregiver may recommend avoiding anti-inflammatory medicines (aspirin, ibuprofen, and naproxen) for 48 hours because these medicines may increase bruising.   If told, use an elastic wrap as directed. This can help reduce swelling. You may remove the wrap for sleeping, showering, and bathing. If your fingers become numb, cold, or blue, take the wrap off and reapply it more loosely.   Elevate your hand with pillows to reduce swelling.   Avoid overusing your hand if it is painful.  SEEK IMMEDIATE MEDICAL CARE IF:    You have increased redness, swelling, or pain in your hand.   Your swelling or pain is not relieved with medicines.   You have loss of feeling in  your hand or are unable to move your fingers.   Your hand turns cold or blue.   You have pain when you move your fingers.   Your hand becomes warm to the touch.   Your contusion does not improve in 2 days.  MAKE SURE YOU:    Understand these instructions.   Will watch your condition.   Will get help right away if you are not doing well or get worse.     This information is not intended to replace advice given to you by your health care provider. Make sure you discuss any questions you have with your health care provider.     Document Released: 12/08/2001 Document Revised: 03/12/2012 Document Reviewed: 12/10/2011  Elsevier Interactive Patient Education 2016 [employer]
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
## 2015-04-27 NOTE — ED Provider Notes (Signed)
CSN: [id number] date & time 04/27/15  2045 History  By signing my name below, I, Stacy Gordon, attest that this documentation has been prepared under the direction and in the presence of Kerrie Buffalo, NP .  Electronically Signed: Netta Gordon, ED Scribe. 04/27/2015. 10:02 PM.    Chief Complaint  Patient presents with  . Hand Injury   The history is provided by the patient. No language interpreter was used.    HPI Comments: Stacy Gordon is a 24 y.o. female who presents to the Emergency Department complaining of constant, moderate, throbbing, bilateral hand pain, worse over her bilateral 3rd and 4th fingers, that began after hitting a wall two days ago. Pt reports swelling of her hands as an associated symptoms. She has not tried any treatment PTA. Pt reports the onset of pain after hitting a wall in anger. Her tetanus was updated in 2015. Pt denies wrist pain.  Past Medical History  Diagnosis Date  . Scoliosis   . GERD (gastroesophageal reflux disease)    Past Surgical History  Procedure Laterality Date  . Back surgery     Family History  Problem Relation Age of Onset  . Migraines Mother   . Hypertension Mother    Social History  Substance Use Topics  . Smoking status: Current Some Day Smoker  . Smokeless tobacco: None  . Alcohol Use: 0.0 oz/week    0 Standard drinks or equivalent per week     Comment: 1 glass of wine weekly   OB History    No data available     Review of Systems  Constitutional: Negative for fever.  Musculoskeletal: Positive for joint swelling and arthralgias.  Skin: Positive for wound.  All other systems reviewed and are negative.     Allergies  Review of patient's allergies indicates no known allergies.  Home Medications   Prior to Admission medications   Medication Sig Start Date End Date Taking? Authorizing Provider  naproxen (NAPROSYN) 500 MG tablet Take 1 tablet (500 mg total) by mouth 2 (two) times daily. 04/27/15   Hope Orlene Och, NP   BP 116/72 mmHg  Pulse 62  Temp(Src) 98.5 F (36.9 C) (Oral)  Resp 18  SpO2 100%  LMP 04/01/2015 (Approximate) Physical Exam  Constitutional: She appears well-developed and well-nourished. No distress.  HENT:  Head: Normocephalic and atraumatic.  Eyes: Conjunctivae and EOM are normal.  Neck: Normal range of motion. Neck supple.  Cardiovascular: Normal rate.   Pulses:      Radial pulses are 2+ on the right side, and 2+ on the left side.  Adequate circulation  Pulmonary/Chest: Effort normal. No respiratory distress.  Musculoskeletal: She exhibits edema.  Swelling to the 4th and 5th metacarpals on L side R hand with minimal swelling  ecchymosis to dorsum of bilateral hands There is a tiny break in the skin at the base of the 5th finger on the left without erythema or red streaking.  Skin: Skin is warm and dry.  Psychiatric: She has a normal mood and affect. Her behavior is normal.  Nursing note and vitals reviewed.   ED Course  Procedures  DIAGNOSTIC STUDIES: Oxygen Saturation is 99% on RA, normal by my interpretation.    COORDINATION OF CARE: Discussed treatment plan with pt. Pt agreed to plan.   Labs Reviewed - No data to display  Imaging Review Dg Hand Complete Left  04/27/2015  CLINICAL DATA:  Injury to both hands from punching a concrete wall.  Left hand pain at the third, fourth and fifth metacarpals. EXAM: LEFT HAND - COMPLETE 3+ VIEW COMPARISON:  None. FINDINGS: Negative for a fracture or dislocation in the left hand. Soft tissues are unremarkable. Normal alignment of the wrist. IMPRESSION: No acute bone abnormality in the left hand. Electronically Signed   By: Richarda OverlieAdam  Henn M.D.   On: 04/27/2015 21:32   Dg Hand Complete Right  04/27/2015  CLINICAL DATA:  Right hand pain from punching concrete wall. Pain in the third, fourth and fifth metacarpals. EXAM: RIGHT HAND - COMPLETE 3+ VIEW COMPARISON:  None. FINDINGS: Mild thickening of the middle finger middle  phalanx. There appears to be a small exostosis involving the middle finger middle phalanx along the radial aspect near the PIP joint. This appears to be a chronic finding. Negative for an acute fracture or dislocation. Alignment of the hand and wrist are normal. IMPRESSION: No acute bone abnormality to the right hand. Electronically Signed   By: Richarda OverlieAdam  Henn M.D.   On: 04/27/2015 21:35    MDM  24 y.o. female with pain to the dorsum bilateral hands and minimal swelling due to hitting a wall with her fists. Stable for d/c without focal neuro deficits and no fracture or dislocation on x-ray. Discussed with the patient and all questioned fully answered. She will take NSAIDS for pain as needed and follow up with ortho as needed.   Final diagnoses:  Hand contusion, left, initial encounter  Contusion of right hand, initial encounter   I personally performed the services described in this documentation, which was scribed in my presence. The recorded information has been reviewed and is accurate.    [facility] Orlene OchM Neese, NP 04/28/15 1711  Courteney Randall AnLyn Mackuen, MD 04/28/15 2250

## 2015-05-11 ENCOUNTER — Encounter (HOSPITAL_COMMUNITY): Payer: Self-pay | Admitting: Emergency Medicine

## 2015-05-11 ENCOUNTER — Emergency Department (HOSPITAL_COMMUNITY)
Admission: EM | Admit: 2015-05-11 | Discharge: 2015-05-12 | Disposition: A | Discharge status: Home / Self Care | Payer: BLUE CROSS/BLUE SHIELD | Attending: Emergency Medicine | Admitting: Emergency Medicine

## 2015-05-11 DIAGNOSIS — R630 Anorexia: Secondary | ICD-10-CM | POA: Insufficient documentation

## 2015-05-11 DIAGNOSIS — Z8739 Personal history of other diseases of the musculoskeletal system and connective tissue: Secondary | ICD-10-CM | POA: Insufficient documentation

## 2015-05-11 DIAGNOSIS — Z79899 Other long term (current) drug therapy: Secondary | ICD-10-CM | POA: Insufficient documentation

## 2015-05-11 DIAGNOSIS — Z3202 Encounter for pregnancy test, result negative: Secondary | ICD-10-CM | POA: Insufficient documentation

## 2015-05-11 DIAGNOSIS — Z720 Tobacco use: Secondary | ICD-10-CM | POA: Insufficient documentation

## 2015-05-11 DIAGNOSIS — R42 Dizziness and giddiness: Secondary | ICD-10-CM | POA: Insufficient documentation

## 2015-05-11 DIAGNOSIS — K529 Noninfective gastroenteritis and colitis, unspecified: Secondary | ICD-10-CM | POA: Insufficient documentation

## 2015-05-11 LAB — URINALYSIS, ROUTINE W REFLEX MICROSCOPIC
Bilirubin Urine: NEGATIVE
GLUCOSE, UA: NEGATIVE mg/dL
HGB URINE DIPSTICK: NEGATIVE
Ketones, ur: >80 mg/dL — AB
Nitrite: NEGATIVE
PROTEIN: NEGATIVE mg/dL
Specific Gravity, Urine: 1.028 (ref 1.005–1.030)
Urobilinogen, UA: 1 mg/dL (ref 0.0–1.0)
pH: 7 (ref 5.0–8.0)

## 2015-05-11 LAB — COMPREHENSIVE METABOLIC PANEL
ALBUMIN: 4.6 g/dL (ref 3.5–5.0)
ALK PHOS: 52 U/L (ref 38–126)
ALT: 15 U/L (ref 14–54)
AST: 22 U/L (ref 15–41)
Anion gap: 9 (ref 5–15)
BUN: 8 mg/dL (ref 6–20)
CALCIUM: 9.5 mg/dL (ref 8.9–10.3)
CO2: 28 mmol/L (ref 22–32)
CREATININE: 0.77 mg/dL (ref 0.44–1.00)
Chloride: 100 mmol/L — ABNORMAL LOW (ref 101–111)
GFR calc Af Amer: >60 mL/min (ref >60)
GFR calc non Af Amer: >60 mL/min (ref >60)
GLUCOSE: 97 mg/dL (ref 65–99)
Potassium: 3.6 mmol/L (ref 3.5–5.1)
SODIUM: 137 mmol/L (ref 135–145)
Total Bilirubin: 1.5 mg/dL — ABNORMAL HIGH (ref 0.3–1.2)
Total Protein: 8.3 g/dL — ABNORMAL HIGH (ref 6.5–8.1)

## 2015-05-11 LAB — CBC
HCT: 38.4 % (ref 36.0–46.0)
HEMOGLOBIN: 13.4 g/dL (ref 12.0–15.0)
MCH: 30.6 pg (ref 26.0–34.0)
MCHC: 34.9 g/dL (ref 30.0–36.0)
MCV: 87.7 fL (ref 78.0–100.0)
PLATELETS: 301 10*3/uL (ref 150–400)
RBC: 4.38 MIL/uL (ref 3.87–5.11)
RDW: 13.6 % (ref 11.5–15.5)
WBC: 10.1 10*3/uL (ref 4.0–10.5)

## 2015-05-11 LAB — URINE MICROSCOPIC-ADD ON

## 2015-05-11 LAB — LIPASE, BLOOD: Lipase: 22 U/L (ref 11–51)

## 2015-05-11 LAB — I-STAT BETA HCG BLOOD, ED (MC, WL, AP ONLY): I-stat hCG, quantitative: <5 m[IU]/mL (ref <5)

## 2015-05-11 MED ORDER — GI COCKTAIL ~~LOC~~
30.0000 mL | Freq: Once | ORAL | Status: AC
Start: 2015-05-11 — End: 2015-05-11
  Administered 2015-05-11: 30 mL via ORAL
  Filled 2015-05-11: qty 30

## 2015-05-11 MED ORDER — ONDANSETRON HCL 4 MG/2ML IJ SOLN
4.0000 mg | Freq: Once | INTRAMUSCULAR | Status: AC
  Administered 2015-05-11: 4 mg via INTRAVENOUS
  Filled 2015-05-11: qty 2

## 2015-05-11 MED ORDER — ONDANSETRON 4 MG PO TBDP: 4.0000 mg | ORAL_TABLET | Freq: Three times a day (TID) | ORAL | Status: AC | PRN

## 2015-05-11 NOTE — ED Notes (Signed)
Pt states that she has had abdominal pain, N/V/D all day x approx. 15. States that she has not tried OTC meds. Alert and oriented.

## 2015-05-11 NOTE — ED Provider Notes (Signed)
CSN: [id number]     Arrival date & time 05/11/15  1829 History   First MD Initiated Contact with Patient 05/11/15 2152     Chief Complaint  Patient presents with  . Emesis     (Consider location/radiation/quality/duration/timing/severity/associated sxs/prior Treatment) Patient is a 24 y.o. female presenting with vomiting.  Emesis Severity:  Severe Duration:  1 day Timing:  Constant Number of daily episodes:  10x Quality:  Stomach contents Progression:  Unchanged Chronicity:  New Relieved by:  Nothing Worsened by:  Nothing tried Ineffective treatments:  None tried Associated symptoms: abdominal pain (when throwing up at epigastric) and diarrhea (5x)   Associated symptoms: no cough, no headaches and no sore throat   Risk factors: suspect food intake (chinese food, others sick too)     Past Medical History  Diagnosis Date  . Scoliosis   . GERD (gastroesophageal reflux disease)    Past Surgical History  Procedure Laterality Date  . Back surgery     Family History  Problem Relation Age of Onset  . Migraines Mother   . Hypertension Mother    Social History  Substance Use Topics  . Smoking status: Current Some Day Smoker  . Smokeless tobacco: None  . Alcohol Use: 0.0 oz/week    0 Standard drinks or equivalent per week     Comment: 1 glass of wine weekly   OB History    No data available     Review of Systems  Constitutional: Positive for appetite change. Negative for fever (subjective last night).  HENT: Negative for sore throat.   Eyes: Negative for visual disturbance.  Respiratory: Negative for cough and shortness of breath.   Cardiovascular: Negative for chest pain.  Gastrointestinal: Positive for nausea, vomiting, abdominal pain (when throwing up at epigastric) and diarrhea (5x). Negative for constipation and blood in stool.  Genitourinary: Negative for hematuria, vaginal bleeding, vaginal discharge and difficulty urinating.  Musculoskeletal: Negative for  back pain and neck pain.  Skin: Negative for rash.  Neurological: Positive for light-headedness. Negative for syncope and headaches.      Allergies  Review of patient's allergies indicates no known allergies.  Home Medications   Prior to Admission medications   Medication Sig Start Date End Date Taking? Authorizing Provider  ibuprofen (ADVIL,MOTRIN) 200 MG tablet Take 400 mg by mouth every 6 (six) hours as needed for moderate pain.   Yes Historical Provider, MD  naproxen (NAPROSYN) 500 MG tablet Take 1 tablet (500 mg total) by mouth 2 (two) times daily. 04/27/15  Yes Hope Orlene Och, NP  ondansetron (ZOFRAN ODT) 4 MG disintegrating tablet Take 1 tablet (4 mg total) by mouth every 8 (eight) hours as needed for nausea or vomiting. 05/11/15   Alvira Monday, MD   BP 115/60 mmHg  Pulse 54  Temp(Src) 97.9 F (36.6 C) (Oral)  Resp 16  SpO2 100%  LMP 05/01/2015 (Approximate) Physical Exam  Constitutional: She is oriented to person, place, and time. She appears well-developed and well-nourished. No distress.  HENT:  Head: Normocephalic and atraumatic.  Eyes: Conjunctivae and EOM are normal.  Neck: Normal range of motion.  Cardiovascular: Normal rate, regular rhythm, normal heart sounds and intact distal pulses.  Exam reveals no gallop and no friction rub.   No murmur heard. Pulmonary/Chest: Effort normal and breath sounds normal. No respiratory distress. She has no wheezes. She has no rales.  Abdominal: Soft. She exhibits no distension. There is no tenderness. There is no guarding.  Musculoskeletal: She exhibits no  edema or tenderness.  Neurological: She is alert and oriented to person, place, and time.  Skin: Skin is warm and dry. No rash noted. She is not diaphoretic. No erythema.  Nursing note and vitals reviewed.   ED Course  Procedures (including critical care time) Labs Review Labs Reviewed  COMPREHENSIVE METABOLIC PANEL - Abnormal; Notable for the following:    Chloride 100  (*)    Total Protein 8.3 (*)    Total Bilirubin 1.5 (*)    All other components within normal limits  URINALYSIS, ROUTINE W REFLEX MICROSCOPIC (NOT AT [facility]) - Abnormal; Notable for the following:    APPearance CLOUDY (*)    Ketones, ur >80 (*)    Leukocytes, UA SMALL (*)    All other components within normal limits  URINE MICROSCOPIC-ADD ON - Abnormal; Notable for the following:    Squamous Epithelial / LPF MANY (*)    All other components within normal limits  LIPASE, BLOOD  CBC  I-STAT BETA HCG BLOOD, ED ([facility], [facility], [facility] ONLY)    Imaging Review No results found. I have personally reviewed and evaluated these images and lab results as part of my medical decision-making.   EKG Interpretation None      MDM   Final diagnoses:  Gastroenteritis    [age] year old female with no significant medical history presents with concern of nausea vomiting and diarrhea after eating [place] food last night. Patient reports that her friends have also had similar symptoms. Her abdominal exam is benign, and have low suspicion for appendicitis, cholecystitis. Lipase is normal. No sign of acute hepatitis. Pregnancy test is negative. Urinalysis shows no signs of UTI. History and exam are most consistent with gastroenteritis, likely food poisoning by history.  Patient given IV fluids and Zofran with improvement of symptoms. She is discharged with prescription for Zofran, recommendation is continue by mouth hydration, and PCP follow-up.   Alvira MondayErin Falisha Osment, MD 05/11/15 2333

## 2015-05-20 ENCOUNTER — Emergency Department (HOSPITAL_COMMUNITY): Payer: BLUE CROSS/BLUE SHIELD

## 2015-05-20 ENCOUNTER — Emergency Department (HOSPITAL_COMMUNITY)
Admission: EM | Admit: 2015-05-20 | Discharge: 2015-05-21 | Disposition: A | Discharge status: Home / Self Care | Payer: BLUE CROSS/BLUE SHIELD | Attending: Emergency Medicine | Admitting: Emergency Medicine

## 2015-05-20 ENCOUNTER — Encounter (HOSPITAL_COMMUNITY): Payer: Self-pay | Admitting: *Deleted

## 2015-05-20 DIAGNOSIS — Y9389 Activity, other specified: Secondary | ICD-10-CM | POA: Insufficient documentation

## 2015-05-20 DIAGNOSIS — S149XXA Injury of unspecified nerves of neck, initial encounter: Secondary | ICD-10-CM

## 2015-05-20 DIAGNOSIS — Z791 Long term (current) use of non-steroidal anti-inflammatories (NSAID): Secondary | ICD-10-CM | POA: Insufficient documentation

## 2015-05-20 DIAGNOSIS — Z8719 Personal history of other diseases of the digestive system: Secondary | ICD-10-CM | POA: Insufficient documentation

## 2015-05-20 DIAGNOSIS — Z23 Encounter for immunization: Secondary | ICD-10-CM | POA: Insufficient documentation

## 2015-05-20 DIAGNOSIS — S61217A Laceration without foreign body of left little finger without damage to nail, initial encounter: Secondary | ICD-10-CM | POA: Insufficient documentation

## 2015-05-20 DIAGNOSIS — M419 Scoliosis, unspecified: Secondary | ICD-10-CM | POA: Insufficient documentation

## 2015-05-20 DIAGNOSIS — Y9289 Other specified places as the place of occurrence of the external cause: Secondary | ICD-10-CM | POA: Insufficient documentation

## 2015-05-20 DIAGNOSIS — S6492XA Injury of unspecified nerve at wrist and hand level of left arm, initial encounter: Secondary | ICD-10-CM | POA: Insufficient documentation

## 2015-05-20 DIAGNOSIS — Y998 Other external cause status: Secondary | ICD-10-CM | POA: Insufficient documentation

## 2015-05-20 DIAGNOSIS — F17200 Nicotine dependence, unspecified, uncomplicated: Secondary | ICD-10-CM | POA: Insufficient documentation

## 2015-05-20 DIAGNOSIS — W260XXA Contact with knife, initial encounter: Secondary | ICD-10-CM | POA: Insufficient documentation

## 2015-05-20 MED ORDER — CEPHALEXIN 500 MG PO CAPS: 500.0000 mg | ORAL_CAPSULE | Freq: Four times a day (QID) | ORAL | Status: DC

## 2015-05-20 MED ORDER — LIDOCAINE HCL (PF) 1 % IJ SOLN
10.0000 mL | Freq: Once | INTRAMUSCULAR | Status: AC
  Administered 2015-05-20: 10 mL
  Filled 2015-05-20: qty 10

## 2015-05-20 MED ORDER — TETANUS-DIPHTH-ACELL PERTUSSIS 5-2.5-18.5 LF-MCG/0.5 IM SUSP
0.5000 mL | Freq: Once | INTRAMUSCULAR | Status: AC
  Administered 2015-05-20: 0.5 mL via INTRAMUSCULAR
  Filled 2015-05-20: qty 0.5

## 2015-05-20 MED ORDER — IBUPROFEN 800 MG PO TABS
800.0000 mg | ORAL_TABLET | Freq: Once | ORAL | Status: AC
  Administered 2015-05-21: 800 mg via ORAL
  Filled 2015-05-20: qty 1

## 2015-05-20 NOTE — ED Notes (Signed)
Pt states she cut the inside of her left pinky today at 730PM while playing with a pocket knife. Bleeding was controlled before arrival to hospital.

## 2015-05-20 NOTE — ED Provider Notes (Signed)
CSN: [id number]     Arrival date & time 05/20/15  1935 History  By signing my name below, I, Stacy Gordon, attest that this documentation has been prepared under the direction and in the presence of Stacy CorpHannah Mikalyn Hermida, PA-C. Electronically Signed: Phillis HaggisGabriella Gordon, ED Scribe. 05/20/2015. 11:37 PM.   Chief Complaint  Patient presents with  . Extremity Laceration   The history is provided by the patient. No language interpreter was used.  HPI Comments: Stacy Gordon is a 24 y.o. female brought in by EMS who presents to the Emergency Department complaining of a medial left 5th finger laceration onset 2 hour ago. Pt states that she was playing with a pocket knife when she cut her finger. She reports that at the time that the knife did become lodged in the bone. She reports worsening pain with flexion of the finger. Bleeding is controlled. She states that her tdap was updated today in the ED. She denies hx of significant medical problems. She denies color change, either, chills, or weakness.  She does report some associated numbness of the hand. No aggravating or alleviating factors at this time.   Past Medical History  Diagnosis Date  . Scoliosis   . GERD (gastroesophageal reflux disease)    Past Surgical History  Procedure Laterality Date  . Back surgery     Family History  Problem Relation Age of Onset  . Migraines Mother   . Hypertension Mother    Social History  Substance Use Topics  . Smoking status: Current Some Day Smoker  . Smokeless tobacco: None  . Alcohol Use: 0.0 oz/week    0 Standard drinks or equivalent per week     Comment: 1 glass of wine weekly   OB History    No data available     Review of Systems  Constitutional: Negative for fever.  Gastrointestinal: Negative for nausea and vomiting.  Skin: Positive for wound. Negative for color change.  Allergic/Immunologic: Negative for immunocompromised state.  Neurological: Negative for weakness and numbness.   Hematological: Does not bruise/bleed easily.  Psychiatric/Behavioral: The patient is not nervous/anxious.    Allergies  Review of patient's allergies indicates no known allergies.  Home Medications   Prior to Admission medications   Medication Sig Start Date End Date Taking? Authorizing Provider  cephALEXin (KEFLEX) 500 MG capsule Take 1 capsule (500 mg total) by mouth 4 (four) times daily. 05/20/15   Stacy Sotto, PA-C  ibuprofen (ADVIL,MOTRIN) 200 MG tablet Take 400 mg by mouth every 6 (six) hours as needed for moderate pain.    Historical Provider, MD  naproxen (NAPROSYN) 500 MG tablet Take 1 tablet (500 mg total) by mouth 2 (two) times daily. 04/27/15   Stacy Orlene OchM Neese, NP  ondansetron (ZOFRAN ODT) 4 MG disintegrating tablet Take 1 tablet (4 mg total) by mouth every 8 (eight) hours as needed for nausea or vomiting. 05/11/15   Stacy MondayErin Schlossman, MD   BP 101/70 mmHg  Pulse 91  Temp(Src) 97.7 F (36.5 C) (Oral)  Resp 18  SpO2 100%  LMP 05/01/2015 (Approximate)  Physical Exam  Constitutional: She is oriented to person, place, and time. She appears well-developed and well-nourished. No distress.  HENT:  Head: Normocephalic and atraumatic.  Eyes: Conjunctivae are normal. No scleral icterus.  Neck: Normal range of motion.  Cardiovascular: Normal rate, regular rhythm, normal heart sounds and intact distal pulses.   No murmur heard. Capillary refill < 3 sec  Pulmonary/Chest: Effort normal and breath sounds normal. No respiratory distress.  Musculoskeletal: Normal range of motion. She exhibits no edema.  ROM: limited ROM due to pain; full ROM with full flexion and extension after digital block  Neurological: She is alert and oriented to person, place, and time.  Sensation: intact to dull and sharp in the radial distribution of the left little finger; absent in the ulnar distribution from site of laceration to the tip Strength: 4/5 with flexion and extension due to pain  Skin: Skin  is warm and dry. She is not diaphoretic.  Psychiatric: She has a normal mood and affect.  Nursing note and vitals reviewed.   ED Course  Procedures (including critical care time) DIAGNOSTIC STUDIES: Oxygen Saturation is 100% on RA, normal by my interpretation.    COORDINATION OF CARE: 9:31 PM-Discussed treatment plan which includes laceration repair with pt at bedside and pt agreed to plan.   LACERATION REPAIR Performed by: Stacy Forth, PA-C Consent: Verbal consent obtained. Risks and benefits: risks, benefits and alternatives were discussed Patient identity confirmed: provided demographic data Time out performed prior to procedure Prepped and Draped in normal sterile fashion Wound explored Laceration Location: left 5th finger Laceration Length: 2 cm No Foreign Bodies seen or palpated Anesthesia: local infiltration Local anesthetic: lidocaine 1% w/o epinephrine Anesthetic total: 5 ml Irrigation method: syringe Amount of cleaning: standard Skin closure: 5-0 Vicryl-rapide Number of sutures or staples: 3 Technique: simple interruped Patient tolerance: Patient tolerated the procedure well with no immediate complications.  11:15 PM- pt currently rates her pain 5/10  11:27 PM- will start pt on anti-biotic; pt denies allergies to medications; Pt is to follow up with Dr. Janee Morn at hand surgery on Monday  Labs Review Labs Reviewed - No data to display  Imaging Review Dg Finger Little Left  05/20/2015  CLINICAL DATA:  Patient with left pinky laceration. EXAM: LEFT LITTLE FINGER 2+V COMPARISON:  Hand radiograph 04/27/2015 FINDINGS: Soft tissue deformity overlying middle phalanx of the little finger. There is a small lucency within the cortex of the proximal aspect of the middle phalanx of the little finger. No evidence for associated acute abnormalities. IMPRESSION: Soft tissue laceration overlying the middle phalanx. There is a small cortical lucency involving the proximal  aspect of the middle phalanx of the little finger compatible with underlying traumatic injury to bone (nondisplaced open fracture). Electronically Signed   By: Annia Belt M.D.   On: 05/20/2015 22:33   I have personally reviewed and evaluated these images and lab results as part of my medical decision-making.   MDM   Final diagnoses:  Laceration of left little finger w/o foreign body w/o damage to nail, initial encounter  Nerve laceration, initial encounter   Stacy Gordon presents with laceration to the left little finger.  Pressure irrigation performed. Wound explored and base of wound visualized in a bloodless field without evidence of foreign body.  Expression of wound reveals cortical defect of the phalanx concerning for injury to the bone from the blade. Patient also with loss of sensation in the ulnar distribution of the left little finger.  Case discussed with Dr. Janee Morn, hand surgery who recommends repair here in the emergency department with follow-up in his office on Monday morning and potential nerve repair on Tuesday. Patient is to be discharged home with Keflex.  Laceration occurred < 8 hours prior to repair which was well tolerated. Tdap updated.  Pt has no comorbidities to effect normal wound healing. Pt discharged with antibiotics.  Discussed suture home care with patient and answered questions. He  should follow-up with Dr. Janee Morn as directed.   BP 116/74 mmHg  Pulse 94  Temp(Src) 97.7 F (36.5 C) (Oral)  Resp 20  SpO2 100%  LMP 05/01/2015 (Approximate)    [facility], PA-C 05/21/15 0023  Doug Sou, MD 05/21/15 0028

## 2015-05-20 NOTE — Discharge Instructions (Signed)
1. Medications: Tylenol or ibuprofen for pain, usual home medications 2. Treatment: ice for swelling, keep wound clean with warm soap and water and keep bandage dry, do not submerge in water for 24 hours 3. Follow Up: Please see Dr. Janee Mornhompson on Monday - His office will call you on Monday morning.  Return to the emergency department for increased redness, drainage of pus from the wound   WOUND CARE  Keep area clean and dry for 24 hours. Do not remove bandage, if applied.  After 24 hours, remove bandage and wash wound gently with mild soap and warm water. Reapply a new bandage after cleaning wound, if directed.   Continue daily cleansing with soap and water until stitches/staples are removed.  Do not apply any ointments or creams to the wound while stitches/staples are in place, as this may cause delayed healing. Return if you experience any of the following signs of infection: Swelling, redness, pus drainage, streaking, fever >101.0 F  Return if you experience excessive bleeding that does not stop after 15-20 minutes of constant, firm pressure.

## 2015-05-20 NOTE — ED Notes (Signed)
Pt arrives to the ER via EMS after cutting her left 5th finger with a knife while cutting fruit; area was bandaged via EMS; bleeding controlled in triage

## 2015-05-21 NOTE — ED Notes (Signed)
Pt alert,oriented, and ambulatory upon DC. She was advised to follow up with Orthopedics and take prescribed antibiotics.

## 2015-07-28 ENCOUNTER — Emergency Department (HOSPITAL_COMMUNITY)
Admission: EM | Admit: 2015-07-28 | Discharge: 2015-07-28 | Disposition: A | Discharge status: Home / Self Care | Payer: BLUE CROSS/BLUE SHIELD | Attending: Emergency Medicine | Admitting: Emergency Medicine

## 2015-07-28 ENCOUNTER — Encounter (HOSPITAL_COMMUNITY): Payer: Self-pay | Admitting: Emergency Medicine

## 2015-07-28 DIAGNOSIS — Z3202 Encounter for pregnancy test, result negative: Secondary | ICD-10-CM | POA: Insufficient documentation

## 2015-07-28 DIAGNOSIS — Z792 Long term (current) use of antibiotics: Secondary | ICD-10-CM | POA: Insufficient documentation

## 2015-07-28 DIAGNOSIS — N390 Urinary tract infection, site not specified: Secondary | ICD-10-CM | POA: Insufficient documentation

## 2015-07-28 DIAGNOSIS — M419 Scoliosis, unspecified: Secondary | ICD-10-CM | POA: Insufficient documentation

## 2015-07-28 DIAGNOSIS — F17200 Nicotine dependence, unspecified, uncomplicated: Secondary | ICD-10-CM | POA: Insufficient documentation

## 2015-07-28 DIAGNOSIS — Z791 Long term (current) use of non-steroidal anti-inflammatories (NSAID): Secondary | ICD-10-CM | POA: Insufficient documentation

## 2015-07-28 DIAGNOSIS — Z8719 Personal history of other diseases of the digestive system: Secondary | ICD-10-CM | POA: Insufficient documentation

## 2015-07-28 LAB — URINALYSIS, ROUTINE W REFLEX MICROSCOPIC
Bilirubin Urine: NEGATIVE
GLUCOSE, UA: NEGATIVE mg/dL
Ketones, ur: NEGATIVE mg/dL
Nitrite: POSITIVE — AB
PROTEIN: 100 mg/dL — AB
SPECIFIC GRAVITY, URINE: 1.021 (ref 1.005–1.030)
pH: 6 (ref 5.0–8.0)

## 2015-07-28 LAB — URINE MICROSCOPIC-ADD ON

## 2015-07-28 LAB — POC URINE PREG, ED: Preg Test, Ur: NEGATIVE

## 2015-07-28 MED ORDER — CEPHALEXIN 500 MG PO CAPS: 1000.0000 mg | ORAL_CAPSULE | Freq: Two times a day (BID) | ORAL | Status: AC

## 2015-07-28 NOTE — ED Provider Notes (Signed)
CSN: [id number]     Arrival date & time 07/28/15  1216 History  By signing my name below, I, Freida Busman, attest that this documentation has been prepared under the direction and in the presence of non-physician practitioner, Fayrene Helper, PA-C. Electronically Signed: Freida Busman, Scribe. 07/28/2015. 3:12 PM.    Chief Complaint  Patient presents with  . Dysuria   The history is provided by the patient. No language interpreter was used.     HPI Comments:  Stacy Gordon is a 25 y.o. female who presents to the Emergency Department complaining of gradually worsening suprapubic abdominal  pain x 5 days. She describes her pain as sharp pain and notes her pain is worse when voiding. She reports associated frequent urination.  She denies hematuria, fever, dysuria, abnormal vaginal discharge/vaginal bleeding and back pain. She has been taking cranberry pills without relief. Pt has a h/o similar milder symptoms a few months ago which she treated with water and cranberry pills. Her LNMP was at end of Dec 2016. She is sexually active with a single female partner. No alleviating factors noted.   Past Medical History  Diagnosis Date  . Scoliosis   . GERD (gastroesophageal reflux disease)    Past Surgical History  Procedure Laterality Date  . Back surgery     Family History  Problem Relation Age of Onset  . Migraines Mother   . Hypertension Mother    Social History  Substance Use Topics  . Smoking status: Current Some Day Smoker  . Smokeless tobacco: None  . Alcohol Use: 0.0 oz/week    0 Standard drinks or equivalent per week     Comment: 1 glass of wine weekly   OB History    No data available     Review of Systems  Constitutional: Negative for fever.  Gastrointestinal: Positive for abdominal pain.  Genitourinary: Positive for frequency. Negative for dysuria, hematuria, vaginal bleeding and vaginal discharge.  Musculoskeletal: Negative for back pain.    Allergies  Review of  patient's allergies indicates no known allergies.  Home Medications   Prior to Admission medications   Medication Sig Start Date End Date Taking? Authorizing Provider  cephALEXin (KEFLEX) 500 MG capsule Take 1 capsule (500 mg total) by mouth 4 (four) times daily. 05/20/15   Hannah Muthersbaugh, PA-C  ibuprofen (ADVIL,MOTRIN) 200 MG tablet Take 400 mg by mouth every 6 (six) hours as needed for moderate pain.    Historical Provider, MD  naproxen (NAPROSYN) 500 MG tablet Take 1 tablet (500 mg total) by mouth 2 (two) times daily. 04/27/15   Hope Orlene Och, NP  ondansetron (ZOFRAN ODT) 4 MG disintegrating tablet Take 1 tablet (4 mg total) by mouth every 8 (eight) hours as needed for nausea or vomiting. 05/11/15   Alvira Monday, MD   BP 125/80 mmHg  Pulse 65  Temp(Src) 98.3 F (36.8 C) (Oral)  Resp 20  SpO2 100%  LMP 07/02/2015 Physical Exam  Constitutional: She is oriented to person, place, and time. She appears well-developed and well-nourished. No distress.  HENT:  Head: Normocephalic and atraumatic.  Eyes: Conjunctivae are normal.  Cardiovascular: Normal rate.   Pulmonary/Chest: Effort normal.  Abdominal: Soft. She exhibits no distension. There is tenderness (mild) in the suprapubic area. There is no CVA tenderness.  Neurological: She is alert and oriented to person, place, and time.  Skin: Skin is warm and dry.  Psychiatric: She has a normal mood and affect.  Nursing note and vitals reviewed.   ED  Course  Procedures   DIAGNOSTIC STUDIES:  Oxygen Saturation is 100% on RA, normal by my interpretation.    COORDINATION OF CARE:  2:59 PM Pt updated with UA results will discharge with antibiotic. Pt instructed to complete course. Discussed treatment plan with pt at bedside and pt agreed to plan.  Labs Review Labs Reviewed  URINALYSIS, ROUTINE W REFLEX MICROSCOPIC (NOT AT [facility]) - Abnormal; Notable for the following:    APPearance TURBID (*)    Hgb urine dipstick MODERATE (*)     Protein, ur 100 (*)    Nitrite POSITIVE (*)    Leukocytes, UA LARGE (*)    All other components within normal limits  URINE MICROSCOPIC-ADD ON - Abnormal; Notable for the following:    Squamous Epithelial / LPF 6-30 (*)    Bacteria, UA MANY (*)    All other components within normal limits  POC URINE PREG, ED    I have personally reviewed and evaluated these lab results as part of my medical decision-making.    MDM   Final diagnoses:  UTI (lower urinary tract infection)    BP 125/80 mmHg  Pulse 65  Temp(Src) 98.3 F (36.8 C) (Oral)  Resp 20  SpO2 100%  LMP 07/02/2015   Pt diagnosed with a UTI. She is afebrile, no tachycardia, no hypotension, or other signs of serious infection.  Pt to be discharged home with Keflex and instructions to finish course and follow up with PCP if symptoms persist. Discussed return precautions. Pt appears safe for discharge.   I personally performed the services described in this documentation, which was scribed in my presence. The recorded information has been reviewed and is accurate.      Fayrene Helper, PA-C 07/28/15 1514  Benjiman Core, MD 07/28/15 1536

## 2015-07-28 NOTE — ED Notes (Signed)
Per EMS-painful urination, lower abdominal pain for over a week-fatigue since yesterday

## 2015-07-28 NOTE — Discharge Instructions (Signed)

## 2017-08-27 IMAGING — DX DG HAND COMPLETE 3+V*R*
3 series · 3 of 3 positions shown · non-contrast
Comparison: None.

CLINICAL DATA: Right hand pain from punching concrete wall. Pain in
the third, fourth and fifth metacarpals.

EXAM:
RIGHT HAND - COMPLETE 3+ VIEW

[x hand pa right]
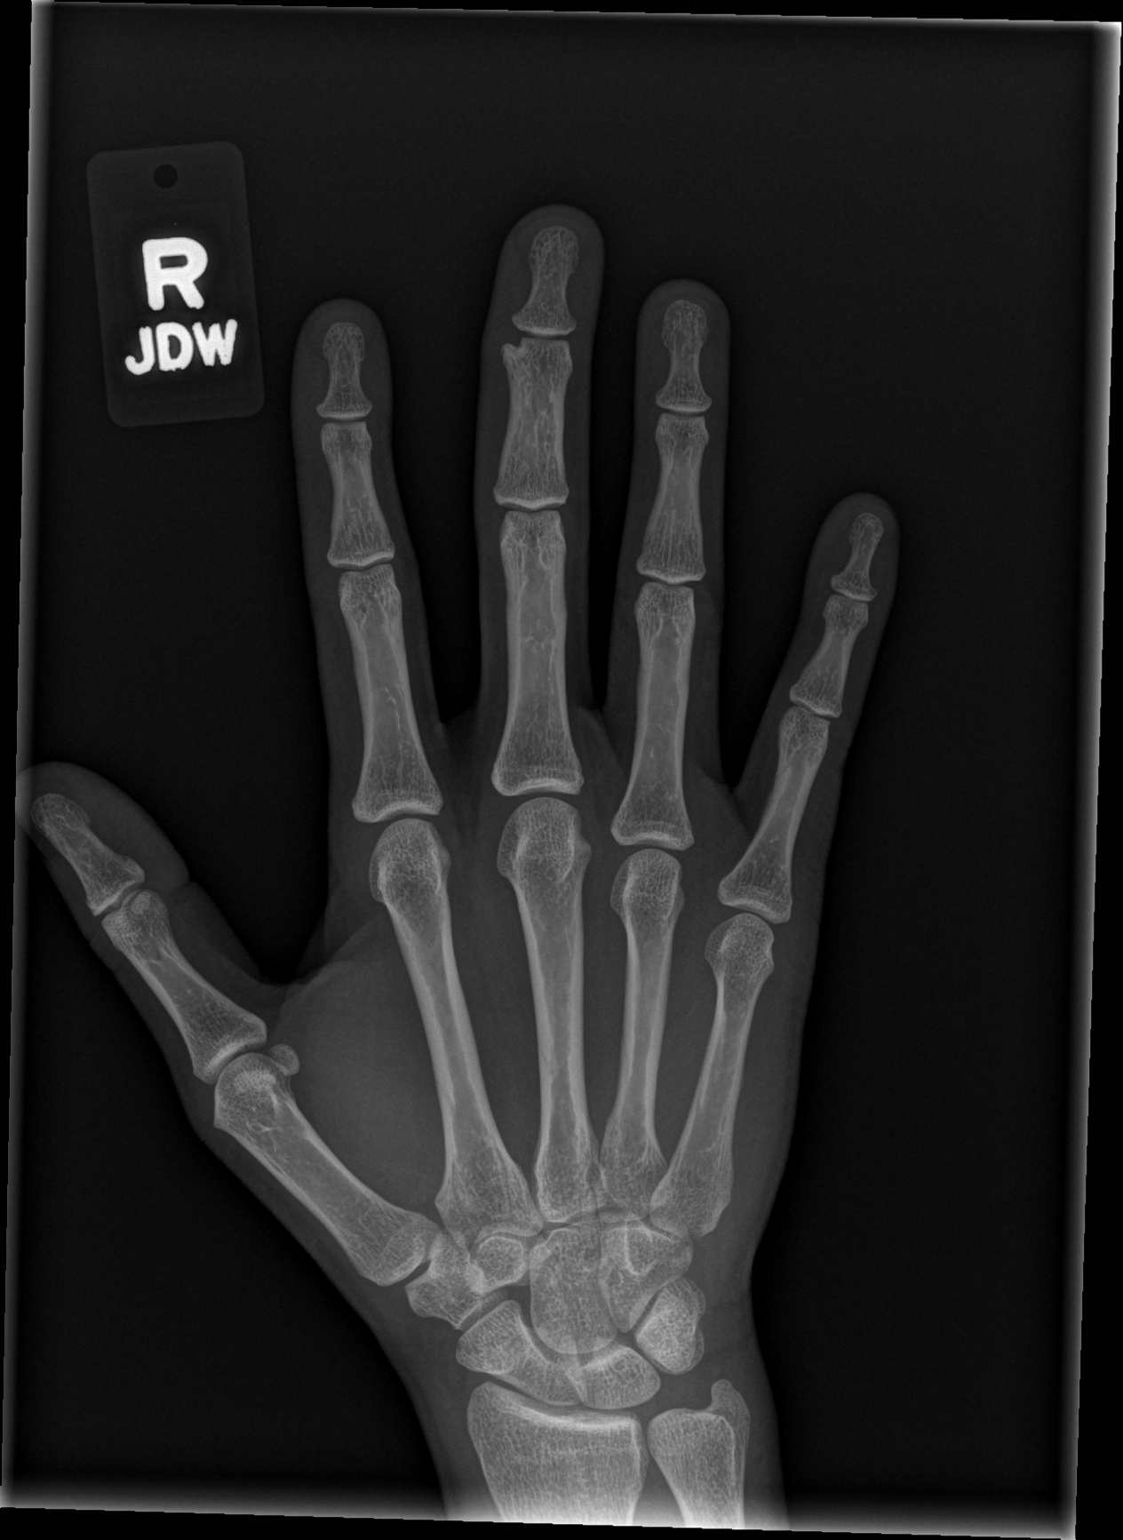

[x hand obl right]
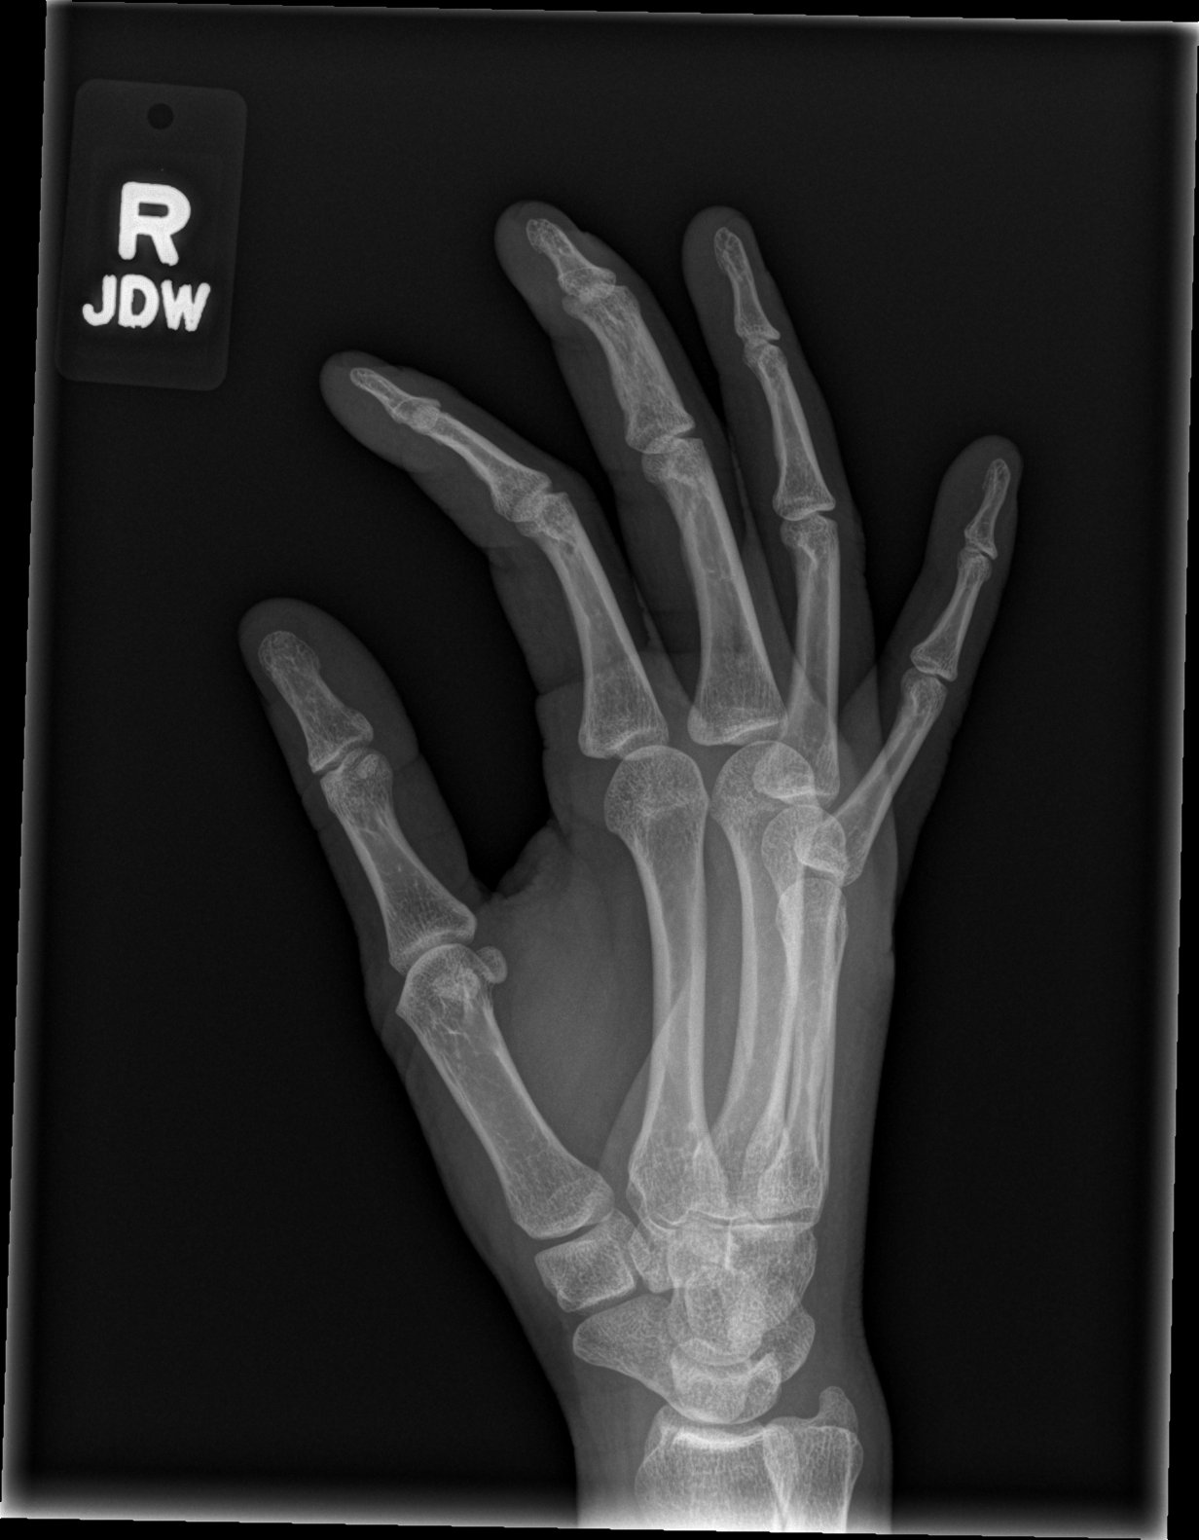

[x hand lat right]
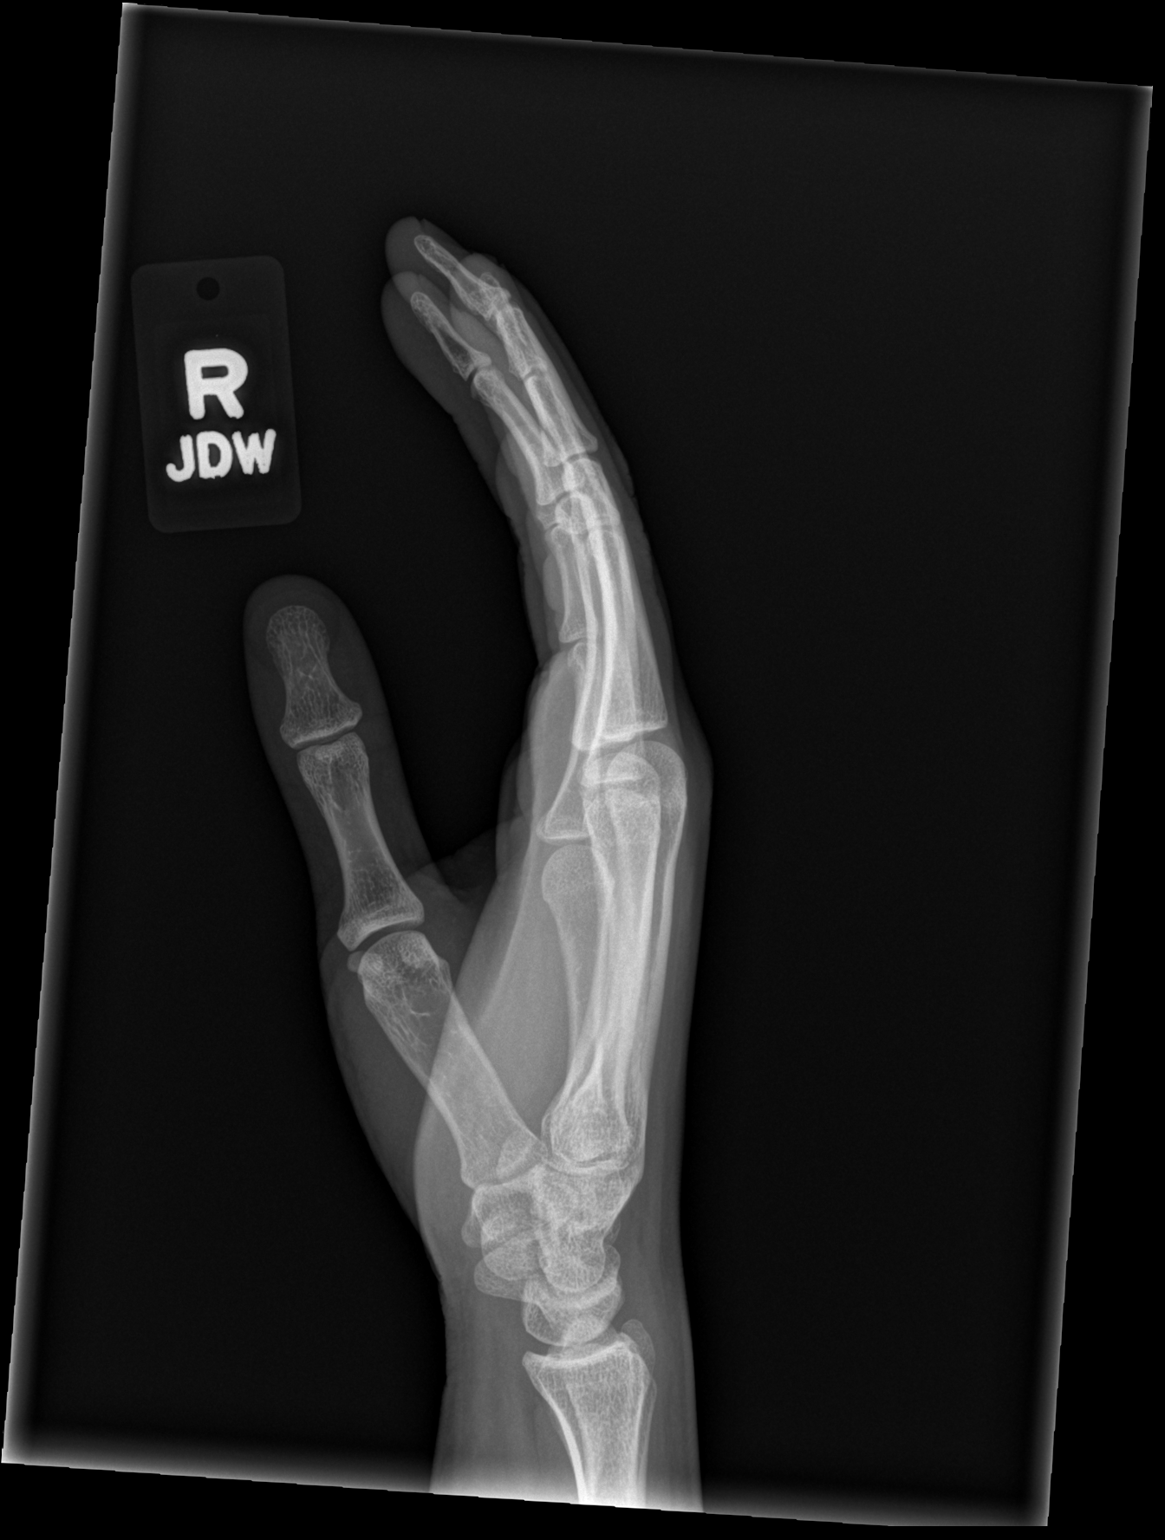

[3 of 3 positions shown; findings below may reference images not displayed]

FINDINGS: Mild thickening of the middle finger middle phalanx. There appears
to be a small exostosis involving the middle finger middle phalanx
along the radial aspect near the PIP joint. This appears to be a
chronic finding. Negative for an acute fracture or dislocation.
Alignment of the hand and wrist are normal.
IMPRESSION: No acute bone abnormality to the right hand.

## 2017-09-19 IMAGING — CR DG FINGER LITTLE 2+V*L*
3 series · 3 of 3 positions shown · non-contrast
Comparison: Hand radiograph 04/27/2015

CLINICAL DATA: Patient with left pinky laceration.

EXAM:
LEFT LITTLE FINGER 2+V

[x finger pa left]
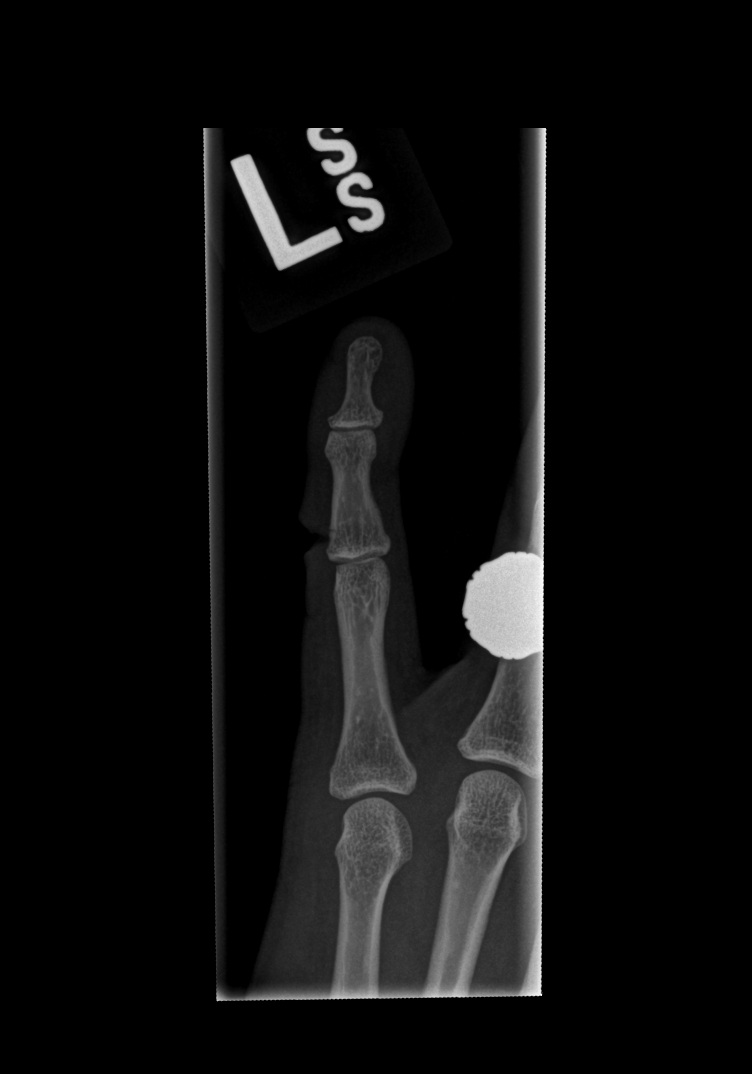

[x finger obl left]
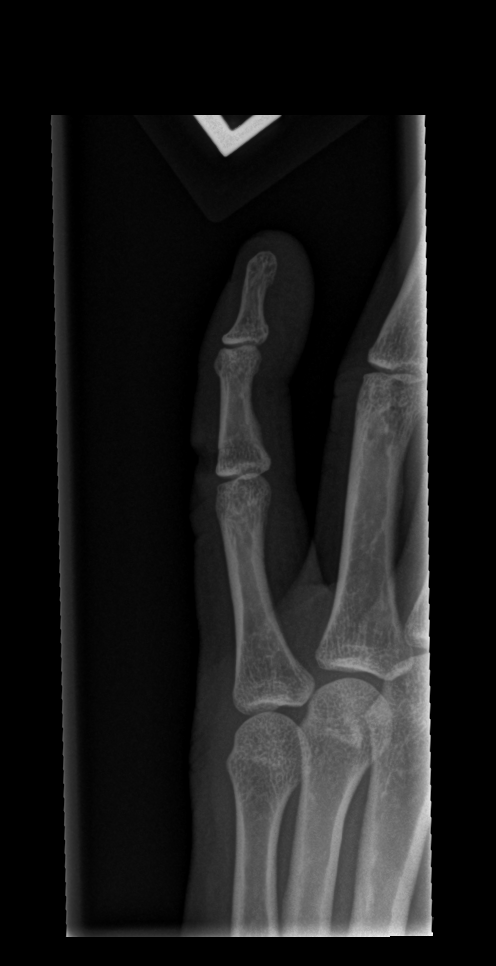

[x finger lat left]
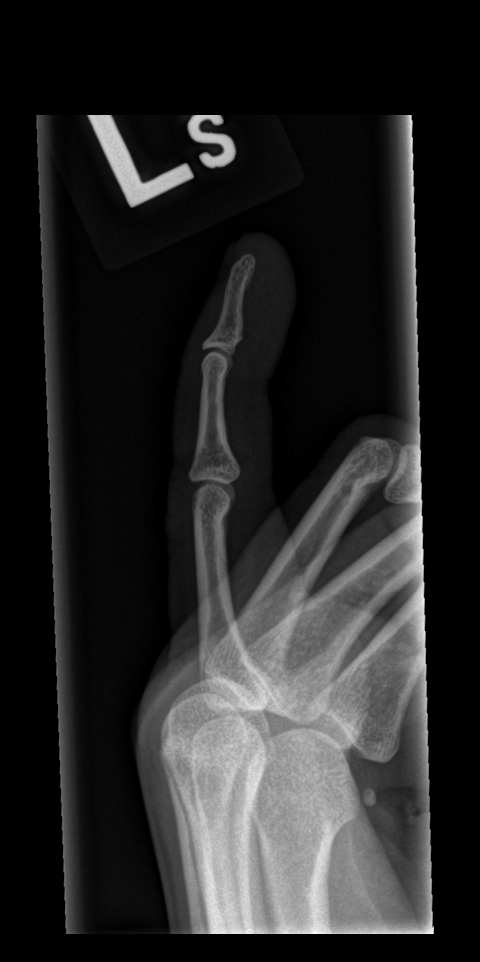

[3 of 3 positions shown; findings below may reference images not displayed]

FINDINGS: Soft tissue deformity overlying middle phalanx of the little finger.
There is a small lucency within the cortex of the proximal aspect of
the middle phalanx of the little finger. No evidence for associated
acute abnormalities.
IMPRESSION: Soft tissue laceration overlying the middle phalanx. There is a
small cortical lucency involving the proximal aspect of the middle
phalanx of the little finger compatible with underlying traumatic
injury to bone (nondisplaced open fracture).
